# Patient Record
Sex: Female | Born: 1940 | Race: White | Hispanic: No | Smoking: Never smoker
Health system: Southern US, Community
[De-identification: ages and names within clinical notes are randomized; demographics above are authoritative.]

## PROBLEM LIST (undated history)

## (undated) DIAGNOSIS — E079 Disorder of thyroid, unspecified: Secondary | ICD-10-CM

## (undated) DIAGNOSIS — E7800 Pure hypercholesterolemia, unspecified: Secondary | ICD-10-CM

## (undated) DIAGNOSIS — I341 Nonrheumatic mitral (valve) prolapse: Secondary | ICD-10-CM

## (undated) DIAGNOSIS — R42 Dizziness and giddiness: Secondary | ICD-10-CM

## (undated) DIAGNOSIS — J986 Disorders of diaphragm: Secondary | ICD-10-CM

## (undated) DIAGNOSIS — I4891 Unspecified atrial fibrillation: Secondary | ICD-10-CM

## (undated) HISTORY — PX: APPENDECTOMY: SHX54

## (undated) HISTORY — PX: CHOLECYSTECTOMY: SHX55

---

## 2019-06-13 ENCOUNTER — Encounter (HOSPITAL_BASED_OUTPATIENT_CLINIC_OR_DEPARTMENT_OTHER): Payer: Self-pay

## 2019-06-13 ENCOUNTER — Emergency Department (HOSPITAL_BASED_OUTPATIENT_CLINIC_OR_DEPARTMENT_OTHER): Payer: Medicare HMO

## 2019-06-13 ENCOUNTER — Emergency Department (HOSPITAL_BASED_OUTPATIENT_CLINIC_OR_DEPARTMENT_OTHER)
Admission: EM | Admit: 2019-06-13 | Discharge: 2019-06-13 | Disposition: A | Discharge status: Home / Self Care | Payer: Medicare HMO | Attending: Emergency Medicine | Admitting: Emergency Medicine

## 2019-06-13 ENCOUNTER — Other Ambulatory Visit: Payer: Self-pay

## 2019-06-13 DIAGNOSIS — Y9389 Activity, other specified: Secondary | ICD-10-CM | POA: Diagnosis not present

## 2019-06-13 DIAGNOSIS — I4891 Unspecified atrial fibrillation: Secondary | ICD-10-CM | POA: Diagnosis not present

## 2019-06-13 DIAGNOSIS — E782 Mixed hyperlipidemia: Secondary | ICD-10-CM | POA: Insufficient documentation

## 2019-06-13 DIAGNOSIS — W19XXXA Unspecified fall, initial encounter: Secondary | ICD-10-CM

## 2019-06-13 DIAGNOSIS — Y92000 Kitchen of unspecified non-institutional (private) residence as  the place of occurrence of the external cause: Secondary | ICD-10-CM | POA: Insufficient documentation

## 2019-06-13 DIAGNOSIS — M25511 Pain in right shoulder: Secondary | ICD-10-CM | POA: Diagnosis present

## 2019-06-13 DIAGNOSIS — Y999 Unspecified external cause status: Secondary | ICD-10-CM | POA: Diagnosis not present

## 2019-06-13 DIAGNOSIS — W01198A Fall on same level from slipping, tripping and stumbling with subsequent striking against other object, initial encounter: Secondary | ICD-10-CM | POA: Insufficient documentation

## 2019-06-13 DIAGNOSIS — M79601 Pain in right arm: Secondary | ICD-10-CM

## 2019-06-13 DIAGNOSIS — S0083XA Contusion of other part of head, initial encounter: Secondary | ICD-10-CM

## 2019-06-13 DIAGNOSIS — E079 Disorder of thyroid, unspecified: Secondary | ICD-10-CM | POA: Insufficient documentation

## 2019-06-13 HISTORY — DX: Nonrheumatic mitral (valve) prolapse: I34.1

## 2019-06-13 HISTORY — DX: Disorders of diaphragm: J98.6

## 2019-06-13 HISTORY — DX: Dizziness and giddiness: R42

## 2019-06-13 HISTORY — DX: Pure hypercholesterolemia, unspecified: E78.00

## 2019-06-13 HISTORY — DX: Unspecified atrial fibrillation: I48.91

## 2019-06-13 HISTORY — DX: Disorder of thyroid, unspecified: E07.9

## 2019-06-13 MED ORDER — ONDANSETRON 4 MG PO TBDP
4.0000 mg | ORAL_TABLET | Freq: Once | ORAL | Status: AC
  Administered 2019-06-13: 17:00:00 4 mg via ORAL
  Filled 2019-06-13: qty 1

## 2019-06-13 MED ORDER — HYDROCODONE-ACETAMINOPHEN 5-325 MG PO TABS: 0.5000 | ORAL_TABLET | Freq: Four times a day (QID) | ORAL | 0 refills | Status: DC | PRN

## 2019-06-13 MED ORDER — HYDROCODONE-ACETAMINOPHEN 5-325 MG PO TABS
1.0000 | ORAL_TABLET | Freq: Once | ORAL | Status: AC
  Administered 2019-06-13: 1 via ORAL
  Filled 2019-06-13: qty 1

## 2019-06-13 MED ORDER — ONDANSETRON 4 MG PO TBDP: 4.0000 mg | ORAL_TABLET | Freq: Three times a day (TID) | ORAL | 0 refills | Status: DC | PRN

## 2019-06-13 MED FILL — ONDANSETRON ODT 4 MG TABLET: 4 | 4 days supply | Qty: 10 | Fill #0

## 2019-06-13 MED FILL — HYDROCODON-APAP 5-325: 5-325 | 3 days supply | Qty: 10 | Fill #0

## 2019-06-13 NOTE — Discharge Instructions (Signed)
Please read and follow all provided instructions.  Your diagnoses today include:  1. Acute pain of right shoulder   2. Pain of right upper extremity   3. Fall, initial encounter   4. Contusion of face, initial encounter     Tests performed today include:  An x-ray of the affected areas - do NOT show any broken bones  Vital signs. See below for your results today.   Medications prescribed:   Vicodin (hydrocodone/acetaminophen) - narcotic pain medication  DO NOT drive or perform any activities that require you to be awake and alert because this medicine can make you drowsy. BE VERY CAREFUL not to take multiple medicines containing Tylenol (also called acetaminophen). Doing so can lead to an overdose which can damage your liver and cause liver failure and possibly death.  Use pain medication only under direct supervision at the lowest possible dose needed to control your pain.    Zofran (ondansetron) - for nausea and vomiting  Take any prescribed medications only as directed.  Home care instructions:   Follow any educational materials contained in this packet  Follow R.I.C.E. Protocol:  R - rest your injury   I  - use ice on injury without applying directly to skin  C - compress injury with bandage or splint  E - elevate the injury as much as possible  Follow-up instructions: Please follow-up with your primary care provider or the provided orthopedic physician (bone specialist) if you continue to have significant pain in 1 week. In this case you may have a more severe injury that requires further care.   Return instructions:   Please return if your fingers are numb or tingling, appear gray or blue, or you have severe pain (also elevate the arm and loosen splint or wrap if you were given one)  Please return to the Emergency Department if you experience worsening symptoms.   Please return if you have any other emergent concerns.  Additional Information:  Your vital  signs today were: BP (!) 186/84 (BP Location: Left Arm)    Pulse 95    Temp 98.3 F (36.8 C) (Oral)    Resp 20    Ht 5\' 3"  (1.6 m)    Wt 70.3 kg    SpO2 99%    BMI 27.46 kg/m  If your blood pressure (BP) was elevated above 135/85 this visit, please have this repeated by your doctor within one month. --------------

## 2019-06-13 NOTE — ED Triage Notes (Addendum)
Pt states she has long hx of vertigo-states she had episode of vertigo and fell 12/5-states she fell on left side-pain to right side of neck and right UE-yellowish bruising noted below left eye and forehead-denies LOC-pt NAD/anxious-to triage in w/c-last dose tylenol ~1230pm-daughter with pt

## 2019-06-13 NOTE — ED Provider Notes (Signed)
[facility] EMERGENCY DEPARTMENT Provider Note   CSN: [id number] Arrival date & time: 06/13/19  1437     History   Chief Complaint Chief Complaint  Patient presents with  . Fall    HPI Nancy NoeMary A Franco is a 78 y.o. female.     Patient with history of chronic dizziness/lightheadedness presents to the emergency department after a fall occurring 4 days ago.  Patient states that she was feeling particularly dizzy at home.  She got up to walk to the restroom and became very weak.  She fell striking the left side of her head on kitchen cabinets and then fell onto her right shoulder area.  Patient complains of worsening pain in her left upper arm and shoulder.  She has spasms of pains at times.  No distal numbness or tingling.  She has not had any confusion, vomiting or mental status changes.  Patient presents with her daughter.  She has been taking a lot of ibuprofen and Tylenol without improvement.  Pain is worse with movement but she has pain at rest as well.  She does not have an orthopedic doctor.  Pain radiates up into the top of her shoulder.  No shortness of breath or trouble breathing.     Past Medical History:  Diagnosis Date  . A-fib (HCC)   . Hemidiaphragm paralysis   . High cholesterol   . MVP (mitral valve prolapse)   . Thyroid disease   . Vertigo     There are no active problems to display for this patient.   Past Surgical History:  Procedure Laterality Date  . APPENDECTOMY    . CHOLECYSTECTOMY       OB History   No obstetric history on file.      Home Medications    Prior to Admission medications   Not on File    Family History No family history on file.  Social History Social History   Tobacco Use  . Smoking status: Never Smoker  . Smokeless tobacco: Never Used  Substance Use Topics  . Alcohol use: Never    Frequency: Never  . Drug use: Never     Allergies   Patient has no known allergies.   Review of Systems Review of  Systems  Constitutional: Negative for activity change and fatigue.  HENT: Negative for tinnitus.   Eyes: Negative for photophobia, pain and visual disturbance.  Respiratory: Negative for shortness of breath.   Cardiovascular: Negative for chest pain.  Gastrointestinal: Negative for nausea and vomiting.  Musculoskeletal: Positive for arthralgias, myalgias and neck pain. Negative for back pain, gait problem and joint swelling.  Skin: Positive for color change. Negative for wound.  Neurological: Negative for dizziness, weakness, light-headedness, numbness and headaches.  Psychiatric/Behavioral: Negative for confusion and decreased concentration.     Physical Exam Updated Vital Signs BP (!) 186/84 (BP Location: Left Arm)   Pulse 95   Temp 98.3 F (36.8 C) (Oral)   Resp 20   Ht 5\' 3"  (1.6 m)   Wt 70.3 kg   SpO2 99%   BMI 27.46 kg/m   Physical Exam Vitals signs and nursing note reviewed.  Constitutional:      Appearance: She is well-developed.  HENT:     Head: Normocephalic and atraumatic. No raccoon eyes or Battle's sign.     Right Ear: Tympanic membrane, ear canal and external ear normal. No hemotympanum.     Left Ear: Tympanic membrane, ear canal and external ear normal. No hemotympanum.  Nose: Nose normal.     Mouth/Throat:     Pharynx: Uvula midline.  Eyes:     General: Lids are normal.     Extraocular Movements:     Right eye: No nystagmus.     Left eye: No nystagmus.     Conjunctiva/sclera: Conjunctivae normal.     Pupils: Pupils are equal, round, and reactive to light.     Comments: No visible hyphema noted  Neck:     Musculoskeletal: Normal range of motion and neck supple.  Cardiovascular:     Rate and Rhythm: Normal rate and regular rhythm.  Pulmonary:     Effort: Pulmonary effort is normal.     Breath sounds: Normal breath sounds.  Abdominal:     Palpations: Abdomen is soft.     Tenderness: There is no abdominal tenderness.  Musculoskeletal:     Right  shoulder: She exhibits tenderness and bony tenderness. She exhibits normal range of motion.     Right elbow: Normal.    Right wrist: Normal.     Cervical back: She exhibits tenderness. She exhibits normal range of motion and no bony tenderness.     Thoracic back: She exhibits no tenderness and no bony tenderness.     Lumbar back: She exhibits no tenderness and no bony tenderness.       Back:     Right upper arm: She exhibits tenderness. She exhibits no swelling and no edema.  Skin:    General: Skin is warm and dry.  Neurological:     Mental Status: She is alert and oriented to person, place, and time.     GCS: GCS eye subscore is 4. GCS verbal subscore is 5. GCS motor subscore is 6.     Cranial Nerves: No cranial nerve deficit.     Sensory: No sensory deficit.     Coordination: Coordination normal.     Deep Tendon Reflexes: Reflexes are normal and symmetric.      ED Treatments / Results  Labs (all labs ordered are listed, but only abnormal results are displayed) Labs Reviewed - No data to display  EKG None  Radiology Dg Shoulder Right  Result Date: 06/13/2019 CLINICAL DATA:  Right shoulder pain since a fall 4-5 days ago. EXAM: RIGHT SHOULDER - 2+ VIEW COMPARISON:  None. FINDINGS: There is no fracture, dislocation, or abnormal self tissue calcification. Minimal osteophyte formation on the inferior rim of the glenoid. IMPRESSION: No acute abnormality. Minimal degenerative changes of the glenohumeral joint. Electronically Signed   By: Francene Boyers M.D.   On: 06/13/2019 16:39   Dg Humerus Right  Result Date: 06/13/2019 CLINICAL DATA:  Right shoulder and arm pain secondary to a fall 4-5 days ago. EXAM: RIGHT HUMERUS - 2+ VIEW COMPARISON:  None. FINDINGS: There is no evidence of fracture or other focal bone lesions. Soft tissues are unremarkable. IMPRESSION: Negative. Electronically Signed   By: Francene Boyers M.D.   On: 06/13/2019 16:39    Procedures Procedures (including  critical care time)  Medications Ordered in ED Medications  HYDROcodone-acetaminophen (NORCO/VICODIN) 5-325 MG per tablet 1 tablet (1 tablet Oral Given 06/13/19 1559)  ondansetron (ZOFRAN-ODT) disintegrating tablet 4 mg (4 mg Oral Given 06/13/19 1706)     Initial Impression / Assessment and Plan / ED Course  I have reviewed the triage vital signs and the nursing notes.  Pertinent labs & imaging results that were available during my care of the patient were reviewed by me and considered in my  medical decision making (see chart for details).        Patient seen and examined. Work-up initiated. Medications ordered.  Right upper extremity is neurovascularly intact.  Patient moves her arm around reasonably well but complains of pain.  Will need imaging to evaluate for fracture.  Discussed possibility of soft tissue injury or ligamentous injury in the shoulder.  Vital signs reviewed and are as follows: BP (!) 186/84 (BP Location: Left Arm)   Pulse 95   Temp 98.3 F (36.8 C) (Oral)   Resp 20   Ht 5\' 3"  (1.6 m)   Wt 70.3 kg   SpO2 99%   BMI 27.46 kg/m   5:26 PM x-rays reviewed.  Patient has tolerated Vicodin.  She has had this in the past.  Current plan is home with prescription for hydrocodone and Zofran.  Patient provided with a sling in the emergency department.  She will use this for comfort.  Sports medicine follow-up given in case symptoms are not improving.  Use pain medication only under direct supervision at the lowest possible dose needed to control your pain.   Patient's family at bedside is in agreement with plan.   Final Clinical Impressions(s) / ED Diagnoses   Final diagnoses:  Acute pain of right shoulder  Pain of right upper extremity  Fall, initial encounter  Contusion of face, initial encounter   Patient with right shoulder and upper arm pain, after a fall several days ago.  Her shoulder and arm pain have been worsening.  Patient with likely musculoskeletal  pain but cannot rule out radicular pain.  She does not have any weakness.  Treatment and follow-up plan as above.  Minor facial contusion.  Patient has had no signs of closed head injury or concussion and she seems to be improving from this standpoint.  Do not feel that she needs imaging of the head, face, neck at this time given clinical provement over the past several days.  ED Discharge Orders         Ordered    HYDROcodone-acetaminophen (NORCO/VICODIN) 5-325 MG tablet  Every 6 hours PRN     06/13/19 1656    ondansetron (ZOFRAN ODT) 4 MG disintegrating tablet  Every 8 hours PRN     06/13/19 1656           Carlisle Cater, PA-C 06/13/19 1729    Lajean Saver, MD 06/13/19 1858

## 2019-06-25 ENCOUNTER — Encounter (HOSPITAL_BASED_OUTPATIENT_CLINIC_OR_DEPARTMENT_OTHER): Payer: Self-pay | Admitting: *Deleted

## 2019-06-25 ENCOUNTER — Inpatient Hospital Stay (HOSPITAL_BASED_OUTPATIENT_CLINIC_OR_DEPARTMENT_OTHER)
Admission: EM | Admit: 2019-06-25 | Discharge: 2019-07-01 | DRG: 392 | Disposition: A | Discharge status: Home / Self Care | Payer: Medicare HMO | Attending: Internal Medicine | Admitting: Internal Medicine

## 2019-06-25 ENCOUNTER — Other Ambulatory Visit: Payer: Self-pay

## 2019-06-25 DIAGNOSIS — Z79890 Hormone replacement therapy (postmenopausal): Secondary | ICD-10-CM

## 2019-06-25 DIAGNOSIS — K529 Noninfective gastroenteritis and colitis, unspecified: Secondary | ICD-10-CM | POA: Diagnosis not present

## 2019-06-25 DIAGNOSIS — R32 Unspecified urinary incontinence: Secondary | ICD-10-CM | POA: Diagnosis present

## 2019-06-25 DIAGNOSIS — E876 Hypokalemia: Secondary | ICD-10-CM | POA: Diagnosis present

## 2019-06-25 DIAGNOSIS — D72829 Elevated white blood cell count, unspecified: Secondary | ICD-10-CM | POA: Diagnosis present

## 2019-06-25 DIAGNOSIS — E860 Dehydration: Secondary | ICD-10-CM | POA: Diagnosis present

## 2019-06-25 DIAGNOSIS — E872 Acidosis: Secondary | ICD-10-CM | POA: Diagnosis present

## 2019-06-25 DIAGNOSIS — N3000 Acute cystitis without hematuria: Secondary | ICD-10-CM | POA: Diagnosis present

## 2019-06-25 DIAGNOSIS — E7800 Pure hypercholesterolemia, unspecified: Secondary | ICD-10-CM | POA: Diagnosis present

## 2019-06-25 DIAGNOSIS — R1084 Generalized abdominal pain: Secondary | ICD-10-CM

## 2019-06-25 DIAGNOSIS — E785 Hyperlipidemia, unspecified: Secondary | ICD-10-CM | POA: Diagnosis present

## 2019-06-25 DIAGNOSIS — I4891 Unspecified atrial fibrillation: Secondary | ICD-10-CM | POA: Diagnosis present

## 2019-06-25 DIAGNOSIS — Z9049 Acquired absence of other specified parts of digestive tract: Secondary | ICD-10-CM

## 2019-06-25 DIAGNOSIS — R109 Unspecified abdominal pain: Secondary | ICD-10-CM

## 2019-06-25 DIAGNOSIS — R739 Hyperglycemia, unspecified: Secondary | ICD-10-CM | POA: Diagnosis present

## 2019-06-25 DIAGNOSIS — Z20828 Contact with and (suspected) exposure to other viral communicable diseases: Secondary | ICD-10-CM | POA: Diagnosis present

## 2019-06-25 DIAGNOSIS — K5900 Constipation, unspecified: Secondary | ICD-10-CM | POA: Diagnosis present

## 2019-06-25 DIAGNOSIS — R17 Unspecified jaundice: Secondary | ICD-10-CM | POA: Diagnosis present

## 2019-06-25 DIAGNOSIS — E039 Hypothyroidism, unspecified: Secondary | ICD-10-CM | POA: Diagnosis present

## 2019-06-25 DIAGNOSIS — I10 Essential (primary) hypertension: Secondary | ICD-10-CM | POA: Diagnosis present

## 2019-06-25 DIAGNOSIS — N390 Urinary tract infection, site not specified: Secondary | ICD-10-CM | POA: Diagnosis present

## 2019-06-25 DIAGNOSIS — R296 Repeated falls: Secondary | ICD-10-CM | POA: Diagnosis present

## 2019-06-25 LAB — CBC WITH DIFFERENTIAL/PLATELET
Abs Immature Granulocytes: 0.05 10*3/uL (ref 0.00–0.07)
Basophils Absolute: 0 10*3/uL (ref 0.0–0.1)
Basophils Relative: 0 %
Eosinophils Absolute: 0 10*3/uL (ref 0.0–0.5)
Eosinophils Relative: 0 %
HCT: 50.3 % — ABNORMAL HIGH (ref 36.0–46.0)
Hemoglobin: 16.5 g/dL — ABNORMAL HIGH (ref 12.0–15.0)
Immature Granulocytes: 0 %
Lymphocytes Relative: 7 %
Lymphs Abs: 1 10*3/uL (ref 0.7–4.0)
MCH: 31.7 pg (ref 26.0–34.0)
MCHC: 32.8 g/dL (ref 30.0–36.0)
MCV: 96.5 fL (ref 80.0–100.0)
Monocytes Absolute: 0.9 10*3/uL (ref 0.1–1.0)
Monocytes Relative: 6 %
Neutro Abs: 13.8 10*3/uL — ABNORMAL HIGH (ref 1.7–7.7)
Neutrophils Relative %: 87 %
Platelets: 281 10*3/uL (ref 150–400)
RBC: 5.21 MIL/uL — ABNORMAL HIGH (ref 3.87–5.11)
RDW: 13.4 % (ref 11.5–15.5)
WBC: 15.9 10*3/uL — ABNORMAL HIGH (ref 4.0–10.5)
nRBC: 0 % (ref 0.0–0.2)

## 2019-06-25 MED ORDER — SODIUM CHLORIDE 0.9 % IV SOLN: INTRAVENOUS | Status: DC

## 2019-06-25 MED ORDER — ONDANSETRON HCL 4 MG/2ML IJ SOLN
4.0000 mg | Freq: Once | INTRAMUSCULAR | Status: AC
  Administered 2019-06-25: 4 mg via INTRAVENOUS
  Filled 2019-06-25: qty 2

## 2019-06-25 MED ORDER — SODIUM CHLORIDE 0.9 % IV BOLUS
500.0000 mL | Freq: Once | INTRAVENOUS | Status: AC
  Administered 2019-06-25: 500 mL via INTRAVENOUS

## 2019-06-25 NOTE — ED Provider Notes (Addendum)
[facility] EMERGENCY DEPARTMENT Provider Note   CSN: [id number] Arrival date & time: 06/25/19  2153     History Chief Complaint  Patient presents with  . Constipation    Nancy Franco is a 78 y.o. female.  Patient with the complaint of abdominal pain kind of mid abdomen constipation other than a little bit of a warty bowel movement today.  And nausea and vomiting to include dry heaves for the past 2 weeks.  Patient was seen December 9 started on pain medication following a fall on December 5.  Patient was started on pain medication for this.  But has not taken it for the past 5 or 6 days.  Patient has not had a past history of constipation.  No fevers.  No blood in the bowel movements or the vomit.  No chest pain no shortness of breath no cough no upper respiratory symptoms.  Past medical history is significant for vertigo atrial fib mitral valve prolapse thyroid disease and high cholesterol.  Had her gallbladder removed and her appendix removed.        Past Medical History:  Diagnosis Date  . A-fib (HCC)   . Hemidiaphragm paralysis   . High cholesterol   . MVP (mitral valve prolapse)   . Thyroid disease   . Vertigo     There are no problems to display for this patient.   Past Surgical History:  Procedure Laterality Date  . APPENDECTOMY    . CHOLECYSTECTOMY       OB History   No obstetric history on file.     No family history on file.  Social History   Tobacco Use  . Smoking status: Never Smoker  . Smokeless tobacco: Never Used  Substance Use Topics  . Alcohol use: Never  . Drug use: Never    Home Medications Prior to Admission medications   Medication Sig Start Date End Date Taking? Authorizing Provider  HYDROcodone-acetaminophen (NORCO/VICODIN) 5-325 MG tablet Take 0.5-1 tablets by mouth every 6 (six) hours as needed for moderate pain or severe pain. 06/13/19   Renne Crigler, PA-C  ondansetron (ZOFRAN ODT) 4 MG disintegrating tablet Take  1 tablet (4 mg total) by mouth every 8 (eight) hours as needed for nausea or vomiting. 06/13/19   Renne Crigler, PA-C    Allergies    Patient has no known allergies.  Review of Systems   Review of Systems  Constitutional: Negative for chills and fever.  HENT: Negative for congestion, rhinorrhea and sore throat.   Eyes: Negative for visual disturbance.  Respiratory: Negative for cough and shortness of breath.   Cardiovascular: Negative for chest pain and leg swelling.  Gastrointestinal: Positive for abdominal pain, constipation, nausea and vomiting. Negative for blood in stool and diarrhea.  Genitourinary: Negative for dysuria.  Musculoskeletal: Negative for back pain and neck pain.  Skin: Negative for rash.  Neurological: Negative for dizziness, light-headedness and headaches.  Hematological: Does not bruise/bleed easily.  Psychiatric/Behavioral: Negative for confusion.    Physical Exam Updated Vital Signs BP 117/70   Pulse 94   Temp 97.7 F (36.5 C)   Resp 16   Ht 1.6 m (5\' 3" )   Wt 70 kg   SpO2 97%   BMI 27.34 kg/m   Physical Exam Vitals and nursing note reviewed.  Constitutional:      General: She is not in acute distress.    Appearance: Normal appearance. She is well-developed.  HENT:     Head: Normocephalic and  atraumatic.  Eyes:     Extraocular Movements: Extraocular movements intact.     Conjunctiva/sclera: Conjunctivae normal.     Pupils: Pupils are equal, round, and reactive to light.  Cardiovascular:     Rate and Rhythm: Normal rate and regular rhythm.     Heart sounds: No murmur.  Pulmonary:     Effort: Pulmonary effort is normal. No respiratory distress.     Breath sounds: Normal breath sounds.  Abdominal:     General: There is no distension.     Palpations: Abdomen is soft.     Tenderness: There is abdominal tenderness.     Comments: Is to palpation around periumbilical area.  No obvious mass or hernia.  Abdomen not distended.  No guarding.    Musculoskeletal:     Cervical back: Normal range of motion and neck supple.  Skin:    General: Skin is warm and dry.  Neurological:     General: No focal deficit present.     Mental Status: She is alert and oriented to person, place, and time.     ED Results / Procedures / Treatments   Labs (all labs ordered are listed, but only abnormal results are displayed) Labs Reviewed  CBC WITH DIFFERENTIAL/PLATELET  COMPREHENSIVE METABOLIC PANEL  LIPASE, BLOOD    EKG EKG Interpretation  Date/Time:  Monday June 25 2019 23:30:44 EST Ventricular Rate:  90 PR Interval:    QRS Duration: 84 QT Interval:  366 QTC Calculation: 448 R Axis:   65 Text Interpretation: Sinus rhythm Atrial premature complexes Borderline T wave abnormalities Baseline wander in lead(s) II aVF No previous ECGs available Confirmed by Fredia Sorrow [phone]) on 06/25/2019 11:41:26 PM   Radiology No results found.  Procedures Procedures (including critical care time)  Medications Ordered in ED Medications  0.9 %  sodium chloride infusion ( Intravenous New Bag/Given 06/25/19 2340)  sodium chloride 0.9 % bolus 500 mL (has no administration in time range)  ondansetron (ZOFRAN) injection 4 mg (4 mg Intravenous Given 06/25/19 2340)    ED Course  I have reviewed the triage vital signs and the nursing notes.  Pertinent labs & imaging results that were available during my care of the patient were reviewed by me and considered in my medical decision making (see chart for details).    MDM Rules/Calculators/A&P                     Elderly patient with abdominal pain and a history for the past 2 weeks of constipation.  But is also having nausea and vomiting.  Patient brought in from home lives with her daughter.  No COVID-19 symptoms.   Patient was on pain medication but has not taken any for the past 6 days.  Was on pain medication following a fall on December 5.  Patient will need labs IV hydration  antinausea medicine and CT scan of the abdomen to rule out any acute abdominal process.  Patient is already had her gallbladder and appendix removed.  Symptoms could be related to just constipation.  Patient not taking any laxative.  CT scan will direct disposition.  No COVID-19 symptoms.   Final Clinical Impression(s) / ED Diagnoses Final diagnoses:  Generalized abdominal pain    Rx / DC Orders ED Discharge Orders    None       Fredia Sorrow, MD 06/25/19 2308    Fredia Sorrow, MD 06/25/19 [phone]

## 2019-06-25 NOTE — ED Notes (Signed)
Up to [facility].

## 2019-06-25 NOTE — ED Triage Notes (Signed)
Pt brought in by EMS from home, c/o constipation x2 weeks , abd pain

## 2019-06-26 ENCOUNTER — Encounter (HOSPITAL_BASED_OUTPATIENT_CLINIC_OR_DEPARTMENT_OTHER): Payer: Self-pay

## 2019-06-26 ENCOUNTER — Emergency Department (HOSPITAL_BASED_OUTPATIENT_CLINIC_OR_DEPARTMENT_OTHER): Payer: Medicare HMO

## 2019-06-26 DIAGNOSIS — N3000 Acute cystitis without hematuria: Secondary | ICD-10-CM

## 2019-06-26 DIAGNOSIS — K5900 Constipation, unspecified: Secondary | ICD-10-CM | POA: Diagnosis not present

## 2019-06-26 DIAGNOSIS — E876 Hypokalemia: Secondary | ICD-10-CM | POA: Diagnosis not present

## 2019-06-26 DIAGNOSIS — Z9049 Acquired absence of other specified parts of digestive tract: Secondary | ICD-10-CM | POA: Diagnosis not present

## 2019-06-26 DIAGNOSIS — E039 Hypothyroidism, unspecified: Secondary | ICD-10-CM | POA: Diagnosis not present

## 2019-06-26 DIAGNOSIS — E872 Acidosis: Secondary | ICD-10-CM | POA: Diagnosis not present

## 2019-06-26 DIAGNOSIS — I10 Essential (primary) hypertension: Secondary | ICD-10-CM | POA: Diagnosis not present

## 2019-06-26 DIAGNOSIS — R296 Repeated falls: Secondary | ICD-10-CM | POA: Diagnosis not present

## 2019-06-26 DIAGNOSIS — Z20828 Contact with and (suspected) exposure to other viral communicable diseases: Secondary | ICD-10-CM | POA: Diagnosis not present

## 2019-06-26 DIAGNOSIS — N390 Urinary tract infection, site not specified: Secondary | ICD-10-CM | POA: Diagnosis present

## 2019-06-26 DIAGNOSIS — K529 Noninfective gastroenteritis and colitis, unspecified: Principal | ICD-10-CM

## 2019-06-26 DIAGNOSIS — E860 Dehydration: Secondary | ICD-10-CM

## 2019-06-26 DIAGNOSIS — E782 Mixed hyperlipidemia: Secondary | ICD-10-CM

## 2019-06-26 DIAGNOSIS — E7800 Pure hypercholesterolemia, unspecified: Secondary | ICD-10-CM | POA: Diagnosis not present

## 2019-06-26 DIAGNOSIS — E785 Hyperlipidemia, unspecified: Secondary | ICD-10-CM | POA: Diagnosis present

## 2019-06-26 DIAGNOSIS — R17 Unspecified jaundice: Secondary | ICD-10-CM | POA: Diagnosis not present

## 2019-06-26 DIAGNOSIS — D72829 Elevated white blood cell count, unspecified: Secondary | ICD-10-CM

## 2019-06-26 DIAGNOSIS — R32 Unspecified urinary incontinence: Secondary | ICD-10-CM | POA: Diagnosis not present

## 2019-06-26 DIAGNOSIS — Z79890 Hormone replacement therapy (postmenopausal): Secondary | ICD-10-CM | POA: Diagnosis not present

## 2019-06-26 DIAGNOSIS — I4891 Unspecified atrial fibrillation: Secondary | ICD-10-CM | POA: Diagnosis not present

## 2019-06-26 DIAGNOSIS — R739 Hyperglycemia, unspecified: Secondary | ICD-10-CM | POA: Diagnosis not present

## 2019-06-26 LAB — COMPREHENSIVE METABOLIC PANEL
ALT: 22 U/L (ref 0–44)
ALT: 24 U/L (ref 0–44)
AST: 24 U/L (ref 15–41)
AST: 28 U/L (ref 15–41)
Albumin: 3.1 g/dL — ABNORMAL LOW (ref 3.5–5.0)
Albumin: 3.7 g/dL (ref 3.5–5.0)
Alkaline Phosphatase: 74 U/L (ref 38–126)
Alkaline Phosphatase: 89 U/L (ref 38–126)
Anion gap: 12 (ref 5–15)
Anion gap: 9 (ref 5–15)
BUN: 18 mg/dL (ref 8–23)
BUN: 21 mg/dL (ref 8–23)
CO2: 23 mmol/L (ref 22–32)
CO2: 25 mmol/L (ref 22–32)
Calcium: 8.6 mg/dL — ABNORMAL LOW (ref 8.9–10.3)
Calcium: 9.2 mg/dL (ref 8.9–10.3)
Chloride: 100 mmol/L (ref 98–111)
Chloride: 105 mmol/L (ref 98–111)
Creatinine, Ser: 0.71 mg/dL (ref 0.44–1.00)
Creatinine, Ser: 0.89 mg/dL (ref 0.44–1.00)
GFR calc Af Amer: >60 mL/min (ref >60)
GFR calc Af Amer: >60 mL/min (ref >60)
GFR calc non Af Amer: >60 mL/min (ref >60)
GFR calc non Af Amer: >60 mL/min (ref >60)
Glucose, Bld: 112 mg/dL — ABNORMAL HIGH (ref 70–99)
Glucose, Bld: 140 mg/dL — ABNORMAL HIGH (ref 70–99)
Potassium: 3.7 mmol/L (ref 3.5–5.1)
Potassium: 4.6 mmol/L (ref 3.5–5.1)
Sodium: 137 mmol/L (ref 135–145)
Sodium: 137 mmol/L (ref 135–145)
Total Bilirubin: 1.9 mg/dL — ABNORMAL HIGH (ref 0.3–1.2)
Total Bilirubin: 2 mg/dL — ABNORMAL HIGH (ref 0.3–1.2)
Total Protein: 6.3 g/dL — ABNORMAL LOW (ref 6.5–8.1)
Total Protein: 7 g/dL (ref 6.5–8.1)

## 2019-06-26 LAB — LACTIC ACID, PLASMA: Lactic Acid, Venous: 1.1 mmol/L (ref 0.5–1.9)

## 2019-06-26 LAB — CBC WITH DIFFERENTIAL/PLATELET
Abs Immature Granulocytes: 0.03 10*3/uL (ref 0.00–0.07)
Basophils Absolute: 0.1 10*3/uL (ref 0.0–0.1)
Basophils Relative: 1 %
Eosinophils Absolute: 0.1 10*3/uL (ref 0.0–0.5)
Eosinophils Relative: 1 %
HCT: 42.8 % (ref 36.0–46.0)
Hemoglobin: 13.7 g/dL (ref 12.0–15.0)
Immature Granulocytes: 0 %
Lymphocytes Relative: 16 %
Lymphs Abs: 1.5 10*3/uL (ref 0.7–4.0)
MCH: 31.4 pg (ref 26.0–34.0)
MCHC: 32 g/dL (ref 30.0–36.0)
MCV: 98.2 fL (ref 80.0–100.0)
Monocytes Absolute: 1 10*3/uL (ref 0.1–1.0)
Monocytes Relative: 10 %
Neutro Abs: 6.7 10*3/uL (ref 1.7–7.7)
Neutrophils Relative %: 72 %
Platelets: 246 10*3/uL (ref 150–400)
RBC: 4.36 MIL/uL (ref 3.87–5.11)
RDW: 13.4 % (ref 11.5–15.5)
WBC: 9.4 10*3/uL (ref 4.0–10.5)
nRBC: 0 % (ref 0.0–0.2)

## 2019-06-26 LAB — URINALYSIS, MICROSCOPIC (REFLEX)

## 2019-06-26 LAB — SARS CORONAVIRUS 2 (TAT 6-24 HRS): SARS Coronavirus 2: NEGATIVE

## 2019-06-26 LAB — URINALYSIS, ROUTINE W REFLEX MICROSCOPIC
Bilirubin Urine: NEGATIVE
Glucose, UA: NEGATIVE mg/dL
Ketones, ur: 15 mg/dL — AB
Leukocytes,Ua: NEGATIVE
Nitrite: POSITIVE — AB
Protein, ur: NEGATIVE mg/dL
Specific Gravity, Urine: 1.01 (ref 1.005–1.030)
pH: 5 (ref 5.0–8.0)

## 2019-06-26 LAB — LIPASE, BLOOD: Lipase: 18 U/L (ref 11–51)

## 2019-06-26 MED ORDER — ONDANSETRON HCL 4 MG/2ML IJ SOLN
4.0000 mg | Freq: Four times a day (QID) | INTRAMUSCULAR | Status: DC | PRN
  Administered 2019-06-26 – 2019-06-29 (×6): 4 mg via INTRAVENOUS
  Filled 2019-06-26 (×6): qty 2

## 2019-06-26 MED ORDER — ONDANSETRON HCL 4 MG/2ML IJ SOLN
4.0000 mg | Freq: Once | INTRAMUSCULAR | Status: AC
  Administered 2019-06-26: 4 mg via INTRAVENOUS
  Filled 2019-06-26: qty 2

## 2019-06-26 MED ORDER — FENTANYL CITRATE (PF) 100 MCG/2ML IJ SOLN
50.0000 ug | INTRAMUSCULAR | Status: DC | PRN
  Administered 2019-06-26 (×2): 50 ug via INTRAVENOUS
  Filled 2019-06-26 (×2): qty 2

## 2019-06-26 MED ORDER — DEXTROSE-NACL 5-0.45 % IV SOLN
INTRAVENOUS | Status: DC
  Administered 2019-06-26: 1000 mL via INTRAVENOUS

## 2019-06-26 MED ORDER — ACETAMINOPHEN 325 MG PO TABS: 650.0000 mg | ORAL_TABLET | Freq: Four times a day (QID) | ORAL | Status: DC | PRN

## 2019-06-26 MED ORDER — ATORVASTATIN CALCIUM 40 MG PO TABS
40.0000 mg | ORAL_TABLET | Freq: Every day | ORAL | Status: DC
  Administered 2019-06-27 – 2019-07-01 (×4): 40 mg via ORAL
  Filled 2019-06-26 (×5): qty 1

## 2019-06-26 MED ORDER — CIPROFLOXACIN IN D5W 400 MG/200ML IV SOLN
400.0000 mg | Freq: Two times a day (BID) | INTRAVENOUS | Status: DC
  Administered 2019-06-26 – 2019-07-01 (×10): 400 mg via INTRAVENOUS
  Filled 2019-06-26 (×10): qty 200

## 2019-06-26 MED ORDER — METOPROLOL SUCCINATE ER 50 MG PO TB24
50.0000 mg | ORAL_TABLET | Freq: Every day | ORAL | Status: DC
  Administered 2019-06-26 – 2019-07-01 (×5): 50 mg via ORAL
  Filled 2019-06-26 (×6): qty 1

## 2019-06-26 MED ORDER — METRONIDAZOLE IN NACL 5-0.79 MG/ML-% IV SOLN
500.0000 mg | Freq: Once | INTRAVENOUS | Status: AC
  Administered 2019-06-26: 500 mg via INTRAVENOUS
  Filled 2019-06-26: qty 100

## 2019-06-26 MED ORDER — ONDANSETRON HCL 4 MG PO TABS
4.0000 mg | ORAL_TABLET | Freq: Four times a day (QID) | ORAL | Status: DC | PRN
  Administered 2019-06-29 – 2019-07-01 (×3): 4 mg via ORAL
  Filled 2019-06-26 (×4): qty 1

## 2019-06-26 MED ORDER — IOHEXOL 300 MG/ML  SOLN
100.0000 mL | Freq: Once | INTRAMUSCULAR | Status: AC | PRN
  Administered 2019-06-26: 100 mL via INTRAVENOUS

## 2019-06-26 MED ORDER — CIPROFLOXACIN IN D5W 400 MG/200ML IV SOLN
400.0000 mg | Freq: Once | INTRAVENOUS | Status: AC
  Administered 2019-06-26: 400 mg via INTRAVENOUS
  Filled 2019-06-26: qty 200

## 2019-06-26 MED ORDER — METRONIDAZOLE IN NACL 5-0.79 MG/ML-% IV SOLN
500.0000 mg | Freq: Three times a day (TID) | INTRAVENOUS | Status: DC
  Administered 2019-06-26 – 2019-07-01 (×15): 500 mg via INTRAVENOUS
  Filled 2019-06-26 (×15): qty 100

## 2019-06-26 MED ORDER — MORPHINE SULFATE (PF) 4 MG/ML IV SOLN
4.0000 mg | Freq: Once | INTRAVENOUS | Status: AC
  Administered 2019-06-26: 4 mg via INTRAVENOUS
  Filled 2019-06-26 (×2): qty 1

## 2019-06-26 MED ORDER — SODIUM CHLORIDE 0.9 % IV SOLN: INTRAVENOUS | Status: DC

## 2019-06-26 MED ORDER — HYDROCODONE-ACETAMINOPHEN 5-325 MG PO TABS
1.0000 | ORAL_TABLET | ORAL | Status: DC | PRN
  Administered 2019-06-26: 2 via ORAL
  Administered 2019-06-27: 1 via ORAL
  Filled 2019-06-26: qty 1
  Filled 2019-06-26: qty 2
  Filled 2019-06-26: qty 1
  Filled 2019-06-26: qty 2

## 2019-06-26 MED ORDER — ACETAMINOPHEN 650 MG RE SUPP: 650.0000 mg | Freq: Four times a day (QID) | RECTAL | Status: DC | PRN

## 2019-06-26 MED ORDER — ASPIRIN 81 MG PO CHEW
81.0000 mg | CHEWABLE_TABLET | Freq: Every day | ORAL | Status: DC
  Administered 2019-06-27 – 2019-07-01 (×4): 81 mg via ORAL
  Filled 2019-06-26 (×5): qty 1

## 2019-06-26 MED ORDER — LEVOTHYROXINE SODIUM 50 MCG PO TABS
50.0000 ug | ORAL_TABLET | Freq: Every day | ORAL | Status: DC
  Administered 2019-06-27 – 2019-07-01 (×5): 50 ug via ORAL
  Filled 2019-06-26 (×5): qty 1

## 2019-06-26 NOTE — ED Provider Notes (Signed)
  Physical Exam  BP (!) 123/56   Pulse 68   Temp 98.4 F (36.9 C) (Oral)   Resp 16   Ht 5\' 3"  (1.6 m)   Wt 70 kg   SpO2 92%   BMI 27.34 kg/m   Physical Exam  ED Course/Procedures     Procedures  MDM  When discussed with patient.  Reportedly patient's daughter and patient both mention going home however this point patient states she does not feel stronger to go home but abdomen is feeling much better than before.  Reviewing records does not appear that more antibiotics been ordered.  Will order Cipro and Flagyl.  We will also start some IV fluids.  Still pending bed placement.  Reportedly no beds available.       Davonna Belling, MD 06/26/19 [phone]

## 2019-06-26 NOTE — ED Notes (Signed)
Pt incontinent of large amount of urine  Pads changed and placed in an adult brief per pt request

## 2019-06-26 NOTE — ED Notes (Addendum)
Called daughter chris   Phone  #  [phone]  And updated on room assignment

## 2019-06-26 NOTE — ED Notes (Signed)
Returned from CT.

## 2019-06-26 NOTE — ED Provider Notes (Signed)
  Patient signed out pending CT scan and lab work.  In brief, presents with nausea and abdominal pain.  Lab work notable for leukocytosis to 15.9.  Patient is afebrile.  CT scan suggestive of colitis.  Patient primarily having constipation.  No diarrhea.  She also has some mural nodule of the duodenum that likely will need an EGD.    2:11 AM On recheck of the patient she continues to complain of abdominal pain.  I ordered Cipro and Flagyl to cover for colitis and morphine for pain.  Noted that the patient did not have a urinalysis ordered.   4:46 AM Patient resting comfortably.  She is continuing to complain of some pain.  Urinalysis shows evidence of a UTI that is nitrite positive.  Patient has already received Cipro.  Ordered urine culture.  Given ongoing discomfort and evidence of both colitis and urinary tract infection, feel it is reasonable for admission for further antibiotics and pain management.  Patient is agreeable to plan.  Physical Exam  BP (!) 101/48   Pulse 84   Temp 98.4 F (36.9 C) (Oral)   Resp 16   Ht 1.6 m (5\' 3" )   Wt 70 kg   SpO2 91%   BMI 27.34 kg/m   Physical Exam  Nontoxic-appearing Diffuse tenderness to palpation  ED Course/Procedures     Procedures  MDM   Problem List Items Addressed This Visit    None    Visit Diagnoses    Generalized abdominal pain    -  Primary   Acute cystitis without hematuria       Colitis                Merryl Hacker, MD 06/26/19 [phone]

## 2019-06-26 NOTE — Progress Notes (Signed)
Placed a call to the pharmacy for med reconcile to be done.  Will send a tech up

## 2019-06-26 NOTE — ED Notes (Signed)
Updated daughter chris on pt

## 2019-06-26 NOTE — Progress Notes (Signed)
Pt wishes to remain a Full Code

## 2019-06-26 NOTE — ED Notes (Signed)
Pt talking to daughter

## 2019-06-26 NOTE — H&P (Signed)
Nancy Franco:[id number] DOB: 1941-02-21 DOA: 06/25/2019     PCP: Patient, No Pcp Per  [facility] Specialists:  none  originally from [place]  Patient arrived to ER on 06/25/19 at 2153  Patient coming from: home Lives  With family     Chief Complaint:   Chief Complaint  Patient presents with  . Constipation    HPI: Nancy Franco is a 78 y.o. female with medical history significant of a.fib. HLD, Vertigo, falls    Presented with  2wk hx of Constipation, Nausea no appetite severe abdominal pain. She was seen yesterday in emergency department complaining of constipation for at least 2 weeks with signconstipationificant abdominal pain.  Patient was rehydrated and was able to be discharged to home. She returned to ER today reporting Nausea  abd pain.  She reports only symptoms of UTi is frequency of urination states she has frequent UTi but never have any dysuria. She has seen Urologist in the past. She is usually treated with antibiotics.  Reports recently she have had increased frequency of urination and had to get up at night to urinate.    She has been having nausea   and dry heaving for 2 weeks.  This has started after patient had recent fall on 06/09/2019 she injured her right shoulder was evaluated in the emergency department and was prescribed Vicodin.  She denies any history of fevers no chest pain or shortness of breath Patient is status post apendectomy and cholecystectomy.   Prior to starting Vicodin she was treating her pain with ibuprofen and Tylenol.  Patient is not on blood thinners secondary to recurrent falls  Infectious risk factors:  Reports N/V abdominal pain,        PCR testing   NEGATIVE    Lab Results  Component Value Date   [place] NEGATIVE 06/26/2019     Regarding pertinent Chronic problems:    Hyperlipidemia -  Not on lipitor hypothyroidism - on synthroid  HTN - lisinopril/HCTZ, toprolol   A. Fib -  -  CHA2DS2 vas score 3 : only on 81mg  of Aspirin   Not on anticoagulation secondary to Risk of Falls,        While in ER: Noted to have WBC 15.9 Ct worrisome for colitis Mural duodenal nodule will need EGD Was started on Cipro/Flagyl And Morphine for pain  UA showed nitrites  urine culture obtained Pt is incontinent of urine  The following Work up has been ordered so far:  Orders Placed This Encounter  Procedures  . Urine culture  . SARS CORONAVIRUS 2 (TAT 6-24 HRS) Nasopharyngeal Nasopharyngeal Swab  . CT Abdomen Pelvis W Contrast  . CBC with Differential  . Comprehensive metabolic panel  . Lipase, blood  . Urinalysis, Routine w reflex microscopic  . Urinalysis, Microscopic (reflex)  . In and Out Cath  . Cardiac monitoring  . Consult to hospitalist  ALL PATIENTS BEING ADMITTED/HAVING PROCEDURES NEED COVID-19 SCREENING  . ED EKG  . EKG 12-Lead  . Place in observation (patient's expected length of stay will be less than 2 midnights)     Following Medications were ordered in ER: Medications  0.9 %  sodium chloride infusion ( Intravenous Stopped 06/26/19 1521)  fentaNYL (SUBLIMAZE) injection 50 mcg (50 mcg Intravenous Given 06/26/19 1600)  dextrose 5 %-0.45 % sodium chloride infusion (1,000 mLs Intravenous New Bag/Given 06/26/19 1602)  ciprofloxacin (CIPRO) IVPB 400 mg (400 mg Intravenous New Bag/Given 06/26/19 1710)  metroNIDAZOLE (  FLAGYL) IVPB 500 mg ( Intravenous Stopped 06/26/19 1705)  sodium chloride 0.9 % bolus 500 mL (0 mLs Intravenous Stopped 06/26/19 0017)  ondansetron (ZOFRAN) injection 4 mg (4 mg Intravenous Given 06/25/19 2340)  iohexol (OMNIPAQUE) 300 MG/ML solution 100 mL (100 mLs Intravenous Contrast Given 06/26/19 0039)  ciprofloxacin (CIPRO) IVPB 400 mg (0 mg Intravenous Stopped 06/26/19 0246)  metroNIDAZOLE (FLAGYL) IVPB 500 mg (0 mg Intravenous Stopped 06/26/19 0358)  morphine 4 MG/ML injection 4 mg (4 mg Intravenous Given 06/26/19 0252)  ondansetron  (ZOFRAN) injection 4 mg (4 mg Intravenous Given 06/26/19 0247)        Consult Orders  (From admission, onward)         Start     Ordered   06/26/19 0444  Consult to hospitalist  ALL PATIENTS BEING ADMITTED/HAVING PROCEDURES NEED COVID-19 SCREENING [facility] notified [facility]) - hospitalist consult  Once    Comments: ALL PATIENTS BEING ADMITTED/HAVING PROCEDURES NEED COVID-19 SCREENING  Provider:  (Not yet assigned)  Question Answer Comment  Place call to: [facility]   Reason for Consult Admit      06/26/19 0443           Significant initial  Findings: Abnormal Labs Reviewed  CBC WITH DIFFERENTIAL/PLATELET - Abnormal; Notable for the following components:      Result Value   WBC 15.9 (*)    RBC 5.21 (*)    Hemoglobin 16.5 (*)    HCT 50.3 (*)    Neutro Abs 13.8 (*)    All other components within normal limits  COMPREHENSIVE METABOLIC PANEL - Abnormal; Notable for the following components:   Glucose, Bld 140 (*)    Total Bilirubin 1.9 (*)    All other components within normal limits  URINALYSIS, ROUTINE W REFLEX MICROSCOPIC - Abnormal; Notable for the following components:   APPearance CLOUDY (*)    Hgb urine dipstick SMALL (*)    Ketones, ur 15 (*)    Nitrite POSITIVE (*)    All other components within normal limits  URINALYSIS, MICROSCOPIC (REFLEX) - Abnormal; Notable for the following components:   Bacteria, UA MANY (*)    All other components within normal limits     Otherwise labs showing:    Recent Labs  Lab 06/26/19 0005  NA 137  K 4.6  CO2 25  GLUCOSE 140*  BUN 21  CREATININE 0.89  CALCIUM 9.2    Cr    Stable,  Lab Results  Component Value Date   CREATININE 0.89 06/26/2019    Recent Labs  Lab 06/26/19 0005  AST 28  ALT 24  ALKPHOS 89  BILITOT 1.9*  PROT 7.0  ALBUMIN 3.7   Lab Results  Component Value Date   CALCIUM 9.2 06/26/2019   WBC        Component Value Date/Time   WBC 15.9 (H) 06/25/2019 2337   ANC      Component Value Date/Time   NEUTROABS 13.8 (H) 06/25/2019 2337   ALC No components found for: LYMPHAB    Plt: Lab Results  Component Value Date   PLT 281 06/25/2019   COVID-19 Labs  No results for input(s): DDIMER, FERRITIN, LDH, CRP in the last 72 hours.  Lab Results  Component Value Date   SARSCOV2NAA NEGATIVE 06/26/2019     HG/HCT stable,     Component Value Date/Time   HGB 16.5 (H) 06/25/2019 2337   HCT 50.3 (H) 06/25/2019 2337    Recent Labs  Lab 06/26/19 0005  LIPASE  18   No results for input(s): AMMONIA in the last 168 hours.  No components found for: LABALBU     ECG: Ordered Personally reviewed by me showing: HR : 90 Rhythm:  NSR,  w PAC   no evidence of ischemic changes QTC 448    UA bacteruria, positive nitrites   Urine analysis:    Component Value Date/Time   COLORURINE YELLOW 06/26/2019 0334   APPEARANCEUR CLOUDY (A) 06/26/2019 0334   LABSPEC 1.010 06/26/2019 0334   PHURINE 5.0 06/26/2019 0334   GLUCOSEU NEGATIVE 06/26/2019 0334   HGBUR SMALL (A) 06/26/2019 0334   BILIRUBINUR NEGATIVE 06/26/2019 0334   KETONESUR 15 (A) 06/26/2019 0334   PROTEINUR NEGATIVE 06/26/2019 0334   NITRITE POSITIVE (A) 06/26/2019 0334   LEUKOCYTESUR NEGATIVE 06/26/2019 0334       Ordered   CTabd/pelvis - colitis    ED Triage Vitals  Enc Vitals Group     BP 06/25/19 2156 117/70     Pulse Rate 06/25/19 2156 94     Resp 06/25/19 2156 16     Temp 06/25/19 2156 97.7 F (36.5 C)     Temp Source 06/26/19 0054 Oral     SpO2 06/25/19 2154 97 %     Weight 06/25/19 2156 154 lb 5.2 oz (70 kg)     Height 06/25/19 2156  (1.6 m)     Head Circumference --      Peak Flow --      Pain Score 06/25/19 2155 10     Pain Loc --      Pain Edu? --      Excl. in GC? --   TMAX(24)@       Latest  Blood pressure (!) 154/74, pulse 87, temperature 98.5 F (36.9 C), temperature source Oral, resp. rate 17, height  (1.6 m), weight 70 kg, SpO2 96 %.   Hospitalist  was called for admission for Colitis and dehydration   Review of Systems:    Pertinent positives include: abdominal pain, nausea, vomiting,  Constitutional:  No weight loss, night sweats, Fevers, chills, fatigue, weight loss  HEENT:  No headaches, Difficulty swallowing,Tooth/dental problems,Sore throat,  No sneezing, itching, ear ache, nasal congestion, post nasal drip,  Cardio-vascular:  No chest pain, Orthopnea, PND, anasarca, dizziness, palpitations.no Bilateral lower extremity swelling  GI:  No heartburn, indigestion,  diarrhea, change in bowel habits, loss of appetite, melena, blood in stool, hematemesis Resp:  no shortness of breath at rest. No dyspnea on exertion, No excess mucus, no productive cough, No non-productive cough, No coughing up of blood.No change in color of mucus.No wheezing. Skin:  no rash or lesions. No jaundice GU:  no dysuria, change in color of urine, no urgency or frequency. No straining to urinate.  No flank pain.  Musculoskeletal:  No joint pain or no joint swelling. No decreased range of motion. No back pain.  Psych:  No change in mood or affect. No depression or anxiety. No memory loss.  Neuro: no localizing neurological complaints, no tingling, no weakness, no double vision, no gait abnormality, no slurred speech, no confusion  All systems reviewed and apart from HOPI all are negative  Past Medical History:   Past Medical History:  Diagnosis Date  . A-fib (HCC)   . Hemidiaphragm paralysis   . High cholesterol   . MVP (mitral valve prolapse)   . Thyroid disease   . Vertigo       Past Surgical History:  Procedure Laterality Date  .  APPENDECTOMY    . CHOLECYSTECTOMY      Social History:  Ambulatory   Gilmer Mor recently    reports that she has never smoked. She has never used smokeless tobacco. She reports that she does not drink alcohol or use drugs.     Family History:   Family History  Problem Relation Age of Onset  . Diabetes  Father   . Breast cancer Other     Allergies: No Known Allergies   Prior to Admission medications   Medication Sig Start Date End Date Taking? Authorizing Provider  HYDROcodone-acetaminophen (NORCO/VICODIN) 5-325 MG tablet Take 0.5-1 tablets by mouth every 6 (six) hours as needed for moderate pain or severe pain. 06/13/19   Renne Crigler, PA-C  ondansetron (ZOFRAN ODT) 4 MG disintegrating tablet Take 1 tablet (4 mg total) by mouth every 8 (eight) hours as needed for nausea or vomiting. 06/13/19   Renne Crigler, PA-C   Physical Exam: Blood pressure (!) 154/74, pulse 87, temperature 98.5 F (36.9 C), temperature source Oral, resp. rate 17, height  (1.6 m), weight 70 kg, SpO2 96 %. 1. General:  in No  Acute distress   well  -appearing 2. Psychological: Alert and Oriented 3. Head/ENT:   Dry Mucous Membranes                          Head Non traumatic, neck supple                            Poor Dentition 4. SKIN: decreased Skin turgor,  Skin clean Dry and intact no rash 5. Heart: Regular rate and rhythm no Murmur, no Rub or gallop 6. Lungs:  Clear to auscultation bilaterally, no wheezes or crackles   7. Abdomen: Soft,  non-tender, Non distended  bowel sounds present 8. Lower extremities: no clubbing, cyanosis, no  edema 9. Neurologically Grossly intact, moving all 4 extremities equally   10. MSK: Normal range of motion   All other LABS:     Recent Labs  Lab 06/25/19 2337  WBC 15.9*  NEUTROABS 13.8*  HGB 16.5*  HCT 50.3*  MCV 96.5  PLT 281     Recent Labs  Lab 06/26/19 0005  NA 137  K 4.6  CL 100  CO2 25  GLUCOSE 140*  BUN 21  CREATININE 0.89  CALCIUM 9.2     Recent Labs  Lab 06/26/19 0005  AST 28  ALT 24  ALKPHOS 89  BILITOT 1.9*  PROT 7.0  ALBUMIN 3.7       Cultures: No results found for: SDES, SPECREQUEST, CULT, REPTSTATUS   Radiological Exams on Admission: CT Abdomen Pelvis W Contrast  Result Date: 06/26/2019 CLINICAL DATA:  Nausea,  vomiting, bowel obstruction suspected skull EXAM: CT ABDOMEN AND PELVIS WITH CONTRAST TECHNIQUE: Multidetector CT imaging of the abdomen and pelvis was performed using the standard protocol following bolus administration of intravenous contrast. CONTRAST:  OMNIPAQUE IOHEXOL 300 MG/ML  SOLN COMPARISON:  None. FINDINGS: Lower chest: Asymmetric elevation of the right hemidiaphragm, nonspecific. Some adjacent atelectatic changes noted. Additional bandlike opacities in the left lung base may reflect atelectasis or scarring. Cardiac size is within normal limits. Extensive coronary artery calcification is noted. 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 Trace pericardial fluid within normal limits. Hepatobiliary: No focal liver abnormality is seen. Patient is post cholecystectomy. Slight prominence of the biliary  tree likely related to reservoir effect. No calcified intraductal gallstones. Pancreas: Unremarkable. No pancreatic ductal dilatation or surrounding inflammatory changes. Spleen: Normal in size without focal abnormality. Adrenals/Urinary Tract: Normal adrenal glands. 2.4 cm fluid attenuation cysts seen in the lower pole right kidney. No worrisome renal lesions. Kidneys enhance and excrete symmetrically. No urolithiasis or hydronephrosis. Bladder is unremarkable. Stomach/Bowel: Distal esophagus and stomach are unremarkable. Small 4 mm mural nodule noted in the second portion of the duodenum (3/36). Distal duodenum take takes a normal course. No small bowel dilatation or wall thickening. The appendix is surgically absent. Interposition of the right colon anterior to the liver. There is extensive segmental thickening of the descending and sigmoid colon with pericolonic hazy stranding. There is relative sparing of the distal sigmoid and rectum. There are scattered colonic diverticula in this region but without identifiable culprit diverticulum. No extraluminal  gas is seen. No organized abscess. No air within the draining veins. No convincing evidence of bowel obstruction. Vascular/Lymphatic: Atherosclerotic plaque within the normal caliber aorta. Additional calcifications in the branch vessels. Reactive adenopathy in the low abdomen. No pathologically enlarged nodes. Reproductive: Anteverted uterus with parametrial calcification. No concerning adnexal lesions. Other: Trace free fluid in the left hemipelvis, likely reactive. No free air. No bowel containing hernia. Musculoskeletal: Multilevel degenerative changes are present in the imaged portions of the spine. 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 Additional degenerative changes noted in the hips. No acute osseous abnormality or suspicious osseous lesion. IMPRESSION: 1. Findings are most consistent with colitis involving the descending and sigmoid colon, with relative sparing of the distal sigmoid and rectum. Differential considerations include infectious, inflammatory, or ischemic etiologies. 2. Small 4 mm mural nodule in the second portion of the duodenum, may represent a small duodenal polyp. Consider evaluation with outpatient EGD. 3. Asymmetric elevation of the right hemidiaphragm, nonspecific. 4. Additional incidental and ancillary findings as detailed above. 5. Aortic Atherosclerosis (ICD10-I70.0). Electronically Signed   By: Kreg ShropshirePrice  DeHay M.D.   On: 06/26/2019 01:03    Chart has been reviewed    Assessment/Plan  78 y.o. female with medical history significant of a.fib. HLD, Vertigo, falls     Admitted for colitis and dehydration  Present on Admission: . Colitis -admit for IV fluids and IV antibiotics bowel rest.  Rehydrate and reassess in a.m. no diarrhea.  . Dehydration -rehydrate and follow orthostatics in the morning  . Hypothyroidism - - Check TSH continue home medications at current dose  . Hyperlipidemia -chronic continue home medications  . Essential  hypertension -restart metoprolol hold off on lisinopril/hydrochlorothiazide for tonight given dehydration restart when patient is back to baseline  . Leukocytosis -likely secondary to colitis and hemoconcentration  . Acute lower UTI  -positive bacteria nitrates and leukocytes in the urine with frequency urination typical for patient prior presentation of UTI.  Patient is already on Cipro await results of urine culture    Other plan as per orders.  DVT prophylaxis:  SCD    Code Status:  FULL CODE  as per patient   I had personally discussed CODE STATUS with patient   Family Communication:   Family not at  Bedside    Disposition Plan:     To home once workup is complete and patient is stable                      Would benefit from PT/OT eval prior to DC  Ordered  Consults called: none  Admission status:  ED Disposition    ED Disposition Condition Comment   Admit  Hospital Area: [facility] [[id number]]  Level of Care: Telemetry [5]  Admit to tele based on following criteria: Monitor for Ischemic changes  Covid Evaluation: Asymptomatic Screening Protocol (No Symptoms)  Diagnosis: Colitis [[id number]]  Admitting Physician: Pearson Grippe [[id number]]  Attending Physician: Pearson Grippe [[id number]]       Obs       Level of care    tele  For 12H   Precautions:  Covid Negative  No active isolations   PPE: Used by the provider:   P100  eye Goggles,  Gloves    Alese Furniss 06/26/2019, 11:02 PM    [facility]     after 2 AM please page floor coverage PA If 7AM-7PM, please contact the day team taking care of the patient using [email or web address]

## 2019-06-26 NOTE — ED Notes (Signed)
Pt resting quietly with eyes closed  No acute distress noted at this time

## 2019-06-27 DIAGNOSIS — I10 Essential (primary) hypertension: Secondary | ICD-10-CM | POA: Diagnosis not present

## 2019-06-27 DIAGNOSIS — E860 Dehydration: Secondary | ICD-10-CM | POA: Diagnosis not present

## 2019-06-27 DIAGNOSIS — R42 Dizziness and giddiness: Secondary | ICD-10-CM

## 2019-06-27 DIAGNOSIS — K529 Noninfective gastroenteritis and colitis, unspecified: Secondary | ICD-10-CM | POA: Diagnosis not present

## 2019-06-27 DIAGNOSIS — N390 Urinary tract infection, site not specified: Secondary | ICD-10-CM | POA: Diagnosis not present

## 2019-06-27 LAB — CBC
HCT: 40.8 % (ref 36.0–46.0)
Hemoglobin: 12.8 g/dL (ref 12.0–15.0)
MCH: 31.8 pg (ref 26.0–34.0)
MCHC: 31.4 g/dL (ref 30.0–36.0)
MCV: 101.2 fL — ABNORMAL HIGH (ref 80.0–100.0)
Platelets: 229 10*3/uL (ref 150–400)
RBC: 4.03 MIL/uL (ref 3.87–5.11)
RDW: 13.7 % (ref 11.5–15.5)
WBC: 8.4 10*3/uL (ref 4.0–10.5)
nRBC: 0 % (ref 0.0–0.2)

## 2019-06-27 LAB — COMPREHENSIVE METABOLIC PANEL
ALT: 25 U/L (ref 0–44)
AST: 33 U/L (ref 15–41)
Albumin: 2.9 g/dL — ABNORMAL LOW (ref 3.5–5.0)
Alkaline Phosphatase: 69 U/L (ref 38–126)
Anion gap: 9 (ref 5–15)
BUN: 17 mg/dL (ref 8–23)
CO2: 22 mmol/L (ref 22–32)
Calcium: 8.3 mg/dL — ABNORMAL LOW (ref 8.9–10.3)
Chloride: 107 mmol/L (ref 98–111)
Creatinine, Ser: 0.69 mg/dL (ref 0.44–1.00)
GFR calc Af Amer: >60 mL/min (ref >60)
GFR calc non Af Amer: >60 mL/min (ref >60)
Glucose, Bld: 82 mg/dL (ref 70–99)
Potassium: 4.1 mmol/L (ref 3.5–5.1)
Sodium: 138 mmol/L (ref 135–145)
Total Bilirubin: 2 mg/dL — ABNORMAL HIGH (ref 0.3–1.2)
Total Protein: 5.7 g/dL — ABNORMAL LOW (ref 6.5–8.1)

## 2019-06-27 LAB — MAGNESIUM: Magnesium: 2 mg/dL (ref 1.7–2.4)

## 2019-06-27 LAB — URINE CULTURE: Culture: NO GROWTH

## 2019-06-27 LAB — GLUCOSE, CAPILLARY: Glucose-Capillary: 124 mg/dL — ABNORMAL HIGH (ref 70–99)

## 2019-06-27 LAB — PHOSPHORUS: Phosphorus: 3.1 mg/dL (ref 2.5–4.6)

## 2019-06-27 LAB — TSH: TSH: 1.053 u[IU]/mL (ref 0.350–4.500)

## 2019-06-27 LAB — PREALBUMIN: Prealbumin: 14.3 mg/dL — ABNORMAL LOW (ref 18–38)

## 2019-06-27 MED ORDER — PRO-STAT SUGAR FREE PO LIQD
30.0000 mL | Freq: Two times a day (BID) | ORAL | Status: DC
  Administered 2019-06-27: 30 mL via ORAL
  Filled 2019-06-27 (×3): qty 30

## 2019-06-27 MED ORDER — ADULT MULTIVITAMIN W/MINERALS CH
1.0000 | ORAL_TABLET | Freq: Every day | ORAL | Status: DC
  Administered 2019-06-27: 09:00:00 1 via ORAL
  Filled 2019-06-27 (×4): qty 1

## 2019-06-27 MED ORDER — MECLIZINE HCL 25 MG PO TABS
25.0000 mg | ORAL_TABLET | Freq: Three times a day (TID) | ORAL | Status: DC | PRN
  Administered 2019-06-28 – 2019-07-01 (×4): 25 mg via ORAL
  Filled 2019-06-27 (×4): qty 1

## 2019-06-27 MED ORDER — BOOST / RESOURCE BREEZE PO LIQD CUSTOM
1.0000 | Freq: Three times a day (TID) | ORAL | Status: DC
  Administered 2019-06-27 – 2019-06-28 (×2): 1 via ORAL

## 2019-06-27 NOTE — Evaluation (Signed)
Physical Therapy Evaluation Patient Details Name: Nancy Franco MRN: [id number] DOB: May 18, 1941 Today's Date: 06/27/2019   History of Present Illness  78 yo female admitted with colitis, UTI, dizziness. Hx of A fib, vertigo, falls  Clinical Impression  On eval, pt required Min assist for mobility. She walked ~35 feet around her room. She tolerated activity fairly well. No c/o dizziness during session. Will continue to progress ambulation as tolerated. Recommend RW use for safe ambulation for now. Will also recommend HHPT f/u if pt is agreeable.     Follow Up Recommendations Supervision OOB/mobility;Home health PT    Equipment Recommendations  None recommended by PT(pt stated she has access to DME if needed)    Recommendations for Other Services       Precautions / Restrictions Precautions Precautions: Fall Restrictions Weight Bearing Restrictions: No      Mobility  Bed Mobility Overal bed mobility: Needs Assistance Bed Mobility: Supine to Sit;Sit to Supine     Supine to sit: Min guard;HOB elevated Sit to supine: Min guard;HOB elevated      Transfers Overall transfer level: Needs assistance Equipment used: None Transfers: Sit to/from Stand Sit to Stand: Min assist         General transfer comment: Assist to steady. VCs hand placement.  Ambulation/Gait Ambulation/Gait assistance: Min Chemical engineer (Feet): 35 Feet Assistive device: IV Pole Gait Pattern/deviations: Step-through pattern;Decreased stride length     General Gait Details: Pt took a few laps around the room while using IV pole for support. She tolerated distance well. No c/o dizziness.  Stairs            Wheelchair Mobility    Modified Rankin (Stroke Patients Only)       Balance Overall balance assessment: Needs assistance;History of Falls         Standing balance support: Bilateral upper extremity supported Standing balance-Leahy Scale: Poor                                Pertinent Vitals/Pain Pain Assessment: Faces Pain Score: 3  Faces Pain Scale: Hurts little more Pain Location: R arm Pain Descriptors / Indicators: Discomfort;Sore Pain Intervention(s): Monitored during session;Repositioned    Home Living Family/patient expects to be discharged to:: Private residence Living Arrangements: Children Available Help at Discharge: Family Type of Home: House Home Access: Level entry     Home Layout: Two level Home Equipment: Environmental consultant - 2 wheels;Cane - single point;Shower seat      Prior Function Level of Independence: Independent with assistive device(s)         Comments: has been using walker and cane since fall     Hand Dominance        Extremity/Trunk Assessment   Upper Extremity Assessment Upper Extremity Assessment: Generalized weakness    Lower Extremity Assessment Lower Extremity Assessment: Generalized weakness    Cervical / Trunk Assessment Cervical / Trunk Assessment: Normal  Communication   Communication: No difficulties  Cognition Arousal/Alertness: Awake/alert Behavior During Therapy: WFL for tasks assessed/performed Overall Cognitive Status: Within Functional Limits for tasks assessed                                        General Comments      Exercises     Assessment/Plan    PT Assessment Patient needs continued PT services  PT Problem List Decreased strength;Decreased mobility;Decreased activity tolerance;Decreased balance;Decreased knowledge of use of DME       PT Treatment Interventions DME instruction;Gait training;Therapeutic activities;Patient/family education;Balance training;Functional mobility training;Therapeutic exercise    PT Goals (Current goals can be found in the Care Plan section)  Acute Rehab PT Goals Patient Stated Goal: to get better. PT Goal Formulation: With patient Time For Goal Achievement: 07/12/19 Potential to Achieve Goals: Good    Frequency  Min 3X/week   Barriers to discharge        Co-evaluation               AM-PAC PT "6 Clicks" Mobility  Outcome Measure Help needed turning from your back to your side while in a flat bed without using bedrails?: A Little Help needed moving from lying on your back to sitting on the side of a flat bed without using bedrails?: A Little Help needed moving to and from a bed to a chair (including a wheelchair)?: A Little Help needed standing up from a chair using your arms (e.g., wheelchair or bedside chair)?: A Little Help needed to walk in hospital room?: A Little Help needed climbing 3-5 steps with a railing? : A Little 6 Click Score: 18    End of Session   Activity Tolerance: Patient tolerated treatment well Patient left: in bed;with call bell/phone within reach;with bed alarm set   PT Visit Diagnosis: Unsteadiness on feet (R26.81);History of falling (Z91.81);Repeated falls (R29.6);Muscle weakness (generalized) (M62.81)    Time: 1601-0932 PT Time Calculation (min) (ACUTE ONLY): 11 min   Charges:   PT Evaluation $PT Eval Moderate Complexity: 1 Mod          Doreatha Massed, PT Acute Rehabilitation Services

## 2019-06-27 NOTE — Evaluation (Signed)
Occupational Therapy Evaluation Patient Details Name: Nancy Franco MRN: [id number] DOB: Sep 08, 1940 Today's Date: 06/27/2019    History of Present Illness 78 yo female admitted with colitis, UTI, dizziness. Hx of A fib, vertigo, falls   Clinical Impression   Pt admitted with colitis. Pt currently with functional limitations due to the deficits listed below (see OT Problem List).  Pt will benefit from skilled OT to increase their safety and independence with ADL and functional mobility for ADL to facilitate discharge to venue listed below.      Follow Up Recommendations  Home health OT;Supervision/Assistance - 24 hour    Equipment Recommendations  None recommended by OT    Recommendations for Other Services       Precautions / Restrictions Precautions Precautions: Fall Restrictions Weight Bearing Restrictions: No      Mobility Bed Mobility Overal bed mobility: Needs Assistance Bed Mobility: Supine to Sit;Sit to Supine     Supine to sit: Min guard;HOB elevated Sit to supine: Min guard;HOB elevated      Transfers Overall transfer level: Needs assistance Equipment used: None Transfers: Sit to/from Stand Sit to Stand: Min assist         General transfer comment: Assist to steady. VCs hand placement.    Balance Overall balance assessment: Needs assistance;History of Falls         Standing balance support: Bilateral upper extremity supported Standing balance-Leahy Scale: Poor                             ADL either performed or assessed with clinical judgement   ADL Overall ADL's : Needs assistance/impaired Eating/Feeding: Sitting;Set up   Grooming: Set up;Sitting   Upper Body Bathing: Minimal assistance;Sitting   Lower Body Bathing: Minimal assistance;Sit to/from stand;Cueing for sequencing;Cueing for safety   Upper Body Dressing : Set up;Sitting   Lower Body Dressing: Minimal assistance;Sit to/from stand   Toilet Transfer: Minimal  assistance;Cueing for safety;Cueing for sequencing;BSC;Stand-pivot   Toileting- Clothing Manipulation and Hygiene: Minimal assistance;Sit to/from stand;Cueing for safety;Cueing for sequencing               Vision Baseline Vision/History: Wears glasses Patient Visual Report: No change from baseline              Pertinent Vitals/Pain Pain Assessment: Faces Pain Score: 3  Faces Pain Scale: Hurts little more Pain Location: R arm Pain Descriptors / Indicators: Discomfort;Sore Pain Intervention(s): Monitored during session;Repositioned     Hand Dominance     Extremity/Trunk Assessment Upper Extremity Assessment Upper Extremity Assessment: Generalized weakness   Lower Extremity Assessment Lower Extremity Assessment: Generalized weakness   Cervical / Trunk Assessment Cervical / Trunk Assessment: Normal   Communication Communication Communication: No difficulties   Cognition Arousal/Alertness: Awake/alert Behavior During Therapy: WFL for tasks assessed/performed Overall Cognitive Status: Within Functional Limits for tasks assessed                                                Home Living Family/patient expects to be discharged to:: Private residence Living Arrangements: Children Available Help at Discharge: Family Type of Home: House Home Access: Level entry     Home Layout: Two level Alternate Level Stairs-Number of Steps: 15   Bathroom Shower/Tub: Producer, television/film/video: Standard     Home Equipment:  Walker - 2 wheels;Cane - single point;Shower seat          Prior Functioning/Environment Level of Independence: Independent with assistive device(s)        Comments: has been using walker and cane since fall        OT Problem List: Decreased strength;Impaired balance (sitting and/or standing);Decreased activity tolerance      OT Treatment/Interventions: Self-care/ADL training;Patient/family education;DME and/or AE  instruction    OT Goals(Current goals can be found in the care plan section) Acute Rehab OT Goals Patient Stated Goal: to get better. OT Goal Formulation: With patient Time For Goal Achievement: 07/04/19 ADL Goals Pt Will Perform Grooming: with modified independence;standing Pt Will Perform Lower Body Dressing: with modified independence;sit to/from stand Pt Will Transfer to Toilet: with modified independence;ambulating Pt Will Perform Toileting - Clothing Manipulation and hygiene: with modified independence;sit to/from stand  OT Frequency: Min 2X/week    AM-PAC OT "6 Clicks" Daily Activity     Outcome Measure Help from another person eating meals?: None Help from another person taking care of personal grooming?: A Little Help from another person toileting, which includes using toliet, bedpan, or urinal?: A Little Help from another person bathing (including washing, rinsing, drying)?: A Little Help from another person to put on and taking off regular upper body clothing?: A Little Help from another person to put on and taking off regular lower body clothing?: A Little 6 Click Score: 19   End of Session Nurse Communication: Mobility status  Activity Tolerance: Patient limited by fatigue Patient left: in bed;with call bell/phone within reach;with bed alarm set  OT Visit Diagnosis: Unsteadiness on feet (R26.81);Muscle weakness (generalized) (M62.81);Other abnormalities of gait and mobility (R26.89);History of falling (Z91.81)                Time: 1034-1050 OT Time Calculation (min): 16 min Charges:  OT General Charges $OT Visit: 1 Visit OT Evaluation $OT Eval Moderate Complexity: 1 Mod  Kari Baars, [place] [phone] Office- [phone], Edwena Felty D 06/27/2019, 2:36 PM

## 2019-06-27 NOTE — Plan of Care (Signed)
Initiated care plan 

## 2019-06-27 NOTE — Progress Notes (Signed)
Initial Nutrition Assessment  RD working remotely.   DOCUMENTATION CODES:   Not applicable  INTERVENTION:  - will order Boost Breeze TID, each supplement provides 250 kcal and 9 grams of protein. - will order 30 mL Prostat BID, each supplement provides 100 kcal and 15 grams of protein. - will order daily multivitamin with minerals. - continue to encourage PO intakes and advance diet as medically feasible.    NUTRITION DIAGNOSIS:   Inadequate oral intake related to acute illness, nausea, other (see comment)(abdominal pain and constipation) as evidenced by per patient/family report.  GOAL:   Patient will meet greater than or equal to 90% of their needs  MONITOR:   PO intake, Supplement acceptance, Diet advancement, Labs, Weight trends  REASON FOR ASSESSMENT:   Consult Malnutrition Eval  ASSESSMENT:   78 y.o. female with medical history significant of a.fib. HLD, vertigo, and recurrent falls. She presented to the ED on 12/21 with 2 week history of constipation, nausea, lack of appetite, and severe abdominal pain. She was given IV fluids in the ED and was discharged home but returned to the ED on 12/22. She has hx of recurrent UTI that leads to urinary frequency. She last fell on 12/5 during which she injured her R shoulder. She has surgical hx of appendectomy and cholecystectomy.  Patient has been on CLD since admission and no intakes documented since that time. Per chart review, current weight is 154 lb and weight on 12/9 was 155 lb. Weight at [facility] on 05/15/19 was 157 lb. This indicates 3 lb weight loss (2% body weight) in 1.5 months; not significant for time frame.   Patient does not feel she has lost weight in the past 2-3 weeks. She reports that she has been experiencing severe constipation which has led to sharp abdominal pain and associated nausea. This has been occurring for ~2 weeks and has led to a significant decrease in appetite and lack of desire to even try to eat.  She does try to drink adequate fluids throughout the day, but has been having urinary frequency and feeling unable to keep up with fluid loss.   Per notes: - colitis--IV fluids and bowel rest - dehydration - leukocytosis--thought to be 2/2 colitis and hemoconcentration - acute lower UTI - likely PT and OT evaluation prior to discharge   Labs reviewed; CBG: 124 mg/dl, Ca: 8.3 mg/dl. Medications reviewed; 50 mcg oral synthroid/day.  IVF; NS @ 100 ml/hr.    NUTRITION - FOCUSED PHYSICAL EXAM:  unable to complete at this time.   Diet Order:   Diet Order            Diet clear liquid Room service appropriate? Yes; Fluid consistency: Thin  Diet effective now              EDUCATION NEEDS:   No education needs have been identified at this time  Skin:  Skin Assessment: Reviewed RN Assessment  Last BM:  12/21  Height:   Ht Readings from Last 1 Encounters:  06/25/19 5\' 3"  (1.6 m)    Weight:   Wt Readings from Last 1 Encounters:  06/25/19 70 kg    Ideal Body Weight:  52.3 kg  BMI:  Body mass index is 27.34 kg/m.  Estimated Nutritional Needs:   Kcal:  1750-1960 kcal  Protein:  80-90 grams  Fluid:  >/= 2 L/day      Jarome Matin, MS, RD, LDN, [facility] Inpatient Clinical Dietitian Pager # [phone] After hours/weekend pager # [phone]

## 2019-06-27 NOTE — Progress Notes (Signed)
PROGRESS NOTE    LEVON PENNING  QIH:[id number] DOB: [birth year] DOA: 06/25/2019 PCP: Patient, No Pcp Per  Brief Narrative:  HPI per Toy Baker on 06/26/2019 Kayren Eaves Benko is a 78 y.o. female with medical history significant of a.fib. HLD, Vertigo, falls    Presented with  2wk hx of Constipation, Nausea no appetite severe abdominal pain. She was seen yesterday in emergency department complaining of constipation for at least 2 weeks with signconstipationificant abdominal pain.  Patient was rehydrated and was able to be discharged to home. She returned to ER today reporting Nausea  abd pain.  She reports only symptoms of UTi is frequency of urination states she has frequent UTi but never have any dysuria. She has seen Urologist in the past. She is usually treated with antibiotics.  Reports recently she have had increased frequency of urination and had to get up at night to urinate.    She has been having nausea   and dry heaving for 2 weeks.  This has started after patient had recent fall on 06/09/2019 she injured her right shoulder was evaluated in the emergency department and was prescribed Vicodin.  She denies any history of fevers no chest pain or shortness of breath Patient is status post apendectomy and cholecystectomy.   Prior to starting Vicodin she was treating her pain with ibuprofen and Tylenol.  Patient is not on blood thinners secondary to recurrent falls  Infectious risk factors:  Reports N/V abdominal pain,        PCR testing   NEGATIVE    **Interim History  Patient's abdomen pain is improved however she is complaining of some nausea and some dizziness.  States that she does not like the clear liquid diet that is very bland.  Has not been eating much but states her pain is definitely improved.  Denies any other complaints and diet has been advanced to full liquid diet  Assessment & Plan:   Active Problems:   Colitis   Dehydration   Hypothyroidism   Hyperlipidemia   Essential hypertension   Leukocytosis   Acute lower UTI  Acute Colitis -CT Abdomen and Pelvis showed "Findings are most consistent with colitis involving the descending and sigmoid colon, with relative sparing of the distal sigmoid and rectum. Differential considerations include infectious, inflammatory, or ischemic etiologies.  Small 4 mm mural nodule in the second portion of the duodenum, may represent a small duodenal polyp. Consider evaluation with outpatient EGD.  Asymmetric elevation of the right hemidiaphragm, nonspecific.  Additional incidental and ancillary findings as detailed above." -Admit for IV fluids and IV antibiotics bowel rest. Currently on IV Ciprofloxacin and Metronidazole -Rehydrate and reassess  -Diet Advanced from Clear to FULL Liquid Diet -C/w NS but reduce rate from 100 mL/hr to 75 mL/hr -Abdominal Pain is improving   Dehydration  -Rehydrate and follow orthostatics in the morning  -Repeat CMP this AM   Hypothyroidism  -Checked TSH and was 1.053 -Continue home medications at current dose at Levothyroxine 50 mcg po Daily   Hyperlipidemia -Chronic continue home medications  Essential Hypertension  -Restarted metoprolol  -Continue to hold off on lisinopril/hydrochlorothiazide for tonight given dehydration restart when patient is back to baseline  Leukocytosis -Likely secondary to colitis and hemoconcentration -Improved WBC went from 15.9 -> 8.4 -Continue to Monitor S/Sx of Bleeding -Repeat CBC in AM   Acute lower UTI   -Urinalysis showed cloudy appearance with small hemoglobin, 15 ketones, negative leukocytes, positive nitrites, many bacteria, 0-5 RBCs per high-power  field, and 11-20 WBCs -She has frequency of urination typical for her prior presentation of UTI and she is currently on Cipro -Continue to await urine culture results -Continue to monitor   Hyperglycemia -Blood Sugar on Admission was 140 and 112 on CMP and repeat  this AM was 82 -CBG this AM was 125 -Check HbA1c -Continue to Monitor Blood Sugars carefully -If Necessary will place on Sensitive Novolog SSI  Hyperbilirubinemia -Patient's T Bili this AM was 2.0 -Likely reactive in the setting of Colitis -Continue to Monitor and Trend -Repeat CMP in AM and consider RUQ U/S  DVT prophylaxis: SCDs Code Status: FULL CODE Family Communication: No family present at bedside Disposition Plan: Pending Diet Tolerance   Consultants:   None  Procedures:  None   Antimicrobials:  Anti-infectives (From admission, onward)   Start     Dose/Rate Route Frequency Ordered Stop   06/26/19 1800  ciprofloxacin (CIPRO) IVPB 400 mg     400 mg 200 mL/hr over 60 Minutes Intravenous Every 12 hours 06/26/19 1548     06/26/19 1600  metroNIDAZOLE (FLAGYL) IVPB 500 mg     500 mg 100 mL/hr over 60 Minutes Intravenous Every 8 hours 06/26/19 1548     06/26/19 0145  ciprofloxacin (CIPRO) IVPB 400 mg     400 mg 200 mL/hr over 60 Minutes Intravenous  Once 06/26/19 0131 06/26/19 0246   06/26/19 0145  metroNIDAZOLE (FLAGYL) IVPB 500 mg     500 mg 100 mL/hr over 60 Minutes Intravenous  Once 06/26/19 0131 06/26/19 0358     Subjective: Seen examined at bedside and states that she is doing better from coming in as she had severe abdominal pain.  Continues to have some nausea and felt lightheaded this morning and dizzy.  No other concerns or points at this time but states she did not like the clear liquid diet very much.  Objective: Vitals:   06/26/19 1700 06/26/19 1900 06/26/19 2245 06/27/19 0549  BP: (!) 118/55 (!) 154/74 119/63 126/76  Pulse: 88 87 73 63  Resp: 15 17 17 17   Temp:  98.5 F (36.9 C) 98.1 F (36.7 C) 98.3 F (36.8 C)  TempSrc:  Oral Oral Oral  SpO2: 94% 96% 93% 95%  Weight:      Height:        Intake/Output Summary (Last 24 hours) at 06/27/2019 0751 Last data filed at 06/27/2019 0500 Gross per 24 hour  Intake 1182.57 ml  Output --  Net 1182.57  ml   Filed Weights   06/25/19 2156  Weight: 70 kg   Examination: Physical Exam:  Constitutional: WN/WD overweight Caucasian female in NAD and appears calm and comfortable Eyes: Lids and conjunctivae normal, sclerae anicteric  ENMT: External Ears, Nose appear normal. Grossly normal hearing.  Neck: Appears normal, supple, no cervical masses, normal ROM, no appreciable thyromegaly; no JVD Respiratory: Diminished to auscultation bilaterally, no wheezing, rales, rhonchi or crackles. Normal respiratory effort and patient is not tachypenic. No accessory muscle use.  Cardiovascular: RRR, no murmurs / rubs / gallops. S1 and S2 auscultated. No extremity edema.  Abdomen: Soft, mildly tender, Distended.  Bowel sounds positive x4.  GU: Deferred. Musculoskeletal: No clubbing / cyanosis of digits/nails. No joint deformity upper and lower extremities. Skin: No rashes, lesions, ulcers on a limited skin evaluation. No induration; Warm and dry.  Neurologic: CN 2-12 grossly intact with no focal deficits. Romberg sign and cerebellar reflexes not assessed.  Psychiatric: Normal judgment and insight. Alert and oriented x  3. Pleasant mood and appropriate affect.   Data Reviewed: I have personally reviewed following labs and imaging studies  CBC: Recent Labs  Lab 06/25/19 2337 06/26/19 2043 06/27/19 0325  WBC 15.9* 9.4 8.4  NEUTROABS 13.8* 6.7  --   HGB 16.5* 13.7 12.8  HCT 50.3* 42.8 40.8  MCV 96.5 98.2 101.2*  PLT 281 246 229   Basic Metabolic Panel: Recent Labs  Lab 06/26/19 0005 06/26/19 2043 06/27/19 0325  NA 137 137 138  K 4.6 3.7 4.1  CL 100 105 107  CO2 25 23 22   GLUCOSE 140* 112* 82  BUN 21 18 17   CREATININE 0.89 0.71 0.69  CALCIUM 9.2 8.6* 8.3*  MG  --   --  2.0  PHOS  --   --  3.1   GFR: Estimated Creatinine Clearance: 53.5 mL/min (by C-G formula based on SCr of 0.69 mg/dL). Liver Function Tests: Recent Labs  Lab 06/26/19 0005 06/26/19 2043 06/27/19 0325  AST 28 24 33    ALT 24 22 25   ALKPHOS 89 74 69  BILITOT 1.9* 2.0* 2.0*  PROT 7.0 6.3* 5.7*  ALBUMIN 3.7 3.1* 2.9*   Recent Labs  Lab 06/26/19 0005  LIPASE 18   No results for input(s): AMMONIA in the last 168 hours. Coagulation Profile: No results for input(s): INR, PROTIME in the last 168 hours. Cardiac Enzymes: No results for input(s): CKTOTAL, CKMB, CKMBINDEX, TROPONINI in the last 168 hours. BNP (last 3 results) No results for input(s): PROBNP in the last 8760 hours. HbA1C: No results for input(s): HGBA1C in the last 72 hours. CBG: Recent Labs  Lab 06/27/19 0558  GLUCAP 124*   Lipid Profile: No results for input(s): CHOL, HDL, LDLCALC, TRIG, CHOLHDL, LDLDIRECT in the last 72 hours. Thyroid Function Tests: Recent Labs    06/27/19 0325  TSH 1.053   Anemia Panel: No results for input(s): VITAMINB12, FOLATE, FERRITIN, TIBC, IRON, RETICCTPCT in the last 72 hours. Sepsis Labs: Recent Labs  Lab 06/26/19 2043  LATICACIDVEN 1.1    Recent Results (from the past 240 hour(s))  SARS CORONAVIRUS 2 (TAT 6-24 HRS) Nasopharyngeal Nasopharyngeal Swab     Status: None   Collection Time: 06/26/19  5:09 AM   Specimen: Nasopharyngeal Swab  Result Value Ref Range Status   SARS Coronavirus 2 NEGATIVE NEGATIVE Final    Comment: (NOTE) SARS-CoV-2 target nucleic acids are NOT DETECTED. The SARS-CoV-2 RNA is generally detectable in upper and lower respiratory specimens during the acute phase of infection. Negative results do not preclude SARS-CoV-2 infection, do not rule out co-infections with other pathogens, and should not be used as the sole basis for treatment or other patient management decisions. Negative results must be combined with clinical observations, patient history, and epidemiological information. The expected result is Negative. Fact Sheet for Patients: HairSlick.nohttps://www.fda.gov/media/138098/download Fact Sheet for Healthcare  Providers: quierodirigir.comhttps://www.fda.gov/media/138095/download This test is not yet approved or cleared by the [place] FDA and  has been authorized for detection and/or diagnosis of SARS-CoV-2 by FDA under an Emergency Use Authorization (EUA). This EUA will remain  in effect (meaning this test can be used) for the duration of the COVID-19 declaration under Section 56 4(b)(1) of the Act, 21 U.S.C. section 360bbb-3(b)(1), unless the authorization is terminated or revoked sooner. Performed at [facility], [address] [place], [place] [address]     Radiology Studies: CT Abdomen Pelvis W Contrast  Result Date: 06/26/2019 CLINICAL DATA:  Nausea, vomiting, bowel obstruction suspected skull EXAM: CT  ABDOMEN AND PELVIS WITH CONTRAST TECHNIQUE: Multidetector CT imaging of the abdomen and pelvis was performed using the standard protocol following bolus administration of intravenous contrast. CONTRAST:  OMNIPAQUE IOHEXOL 300 MG/ML  SOLN COMPARISON:  None. FINDINGS: Lower chest: Asymmetric elevation of the right hemidiaphragm, nonspecific. Some adjacent atelectatic changes noted. Additional bandlike opacities in the left lung base may reflect atelectasis or scarring. Cardiac size is within normal limits. Extensive coronary artery calcification is noted. 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 Trace pericardial fluid within normal limits. Hepatobiliary: No focal liver abnormality is seen. Patient is post cholecystectomy. Slight prominence of the biliary tree likely related to reservoir effect. No calcified intraductal gallstones. Pancreas: Unremarkable. No pancreatic ductal dilatation or surrounding inflammatory changes. Spleen: Normal in size without focal abnormality. Adrenals/Urinary Tract: Normal adrenal glands. 2.4 cm fluid attenuation cysts seen in the lower pole right kidney. No worrisome renal lesions. Kidneys enhance and excrete  symmetrically. No urolithiasis or hydronephrosis. Bladder is unremarkable. Stomach/Bowel: Distal esophagus and stomach are unremarkable. Small 4 mm mural nodule noted in the second portion of the duodenum (3/36). Distal duodenum take takes a normal course. No small bowel dilatation or wall thickening. The appendix is surgically absent. Interposition of the right colon anterior to the liver. There is extensive segmental thickening of the descending and sigmoid colon with pericolonic hazy stranding. There is relative sparing of the distal sigmoid and rectum. There are scattered colonic diverticula in this region but without identifiable culprit diverticulum. No extraluminal gas is seen. No organized abscess. No air within the draining veins. No convincing evidence of bowel obstruction. Vascular/Lymphatic: Atherosclerotic plaque within the normal caliber aorta. Additional calcifications in the branch vessels. Reactive adenopathy in the low abdomen. No pathologically enlarged nodes. Reproductive: Anteverted uterus with parametrial calcification. No concerning adnexal lesions. Other: Trace free fluid in the left hemipelvis, likely reactive. No free air. No bowel containing hernia. Musculoskeletal: Multilevel degenerative changes are present in the imaged portions of the spine. 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 Additional degenerative changes noted in the hips. No acute osseous abnormality or suspicious osseous lesion. IMPRESSION: 1. Findings are most consistent with colitis involving the descending and sigmoid colon, with relative sparing of the distal sigmoid and rectum. Differential considerations include infectious, inflammatory, or ischemic etiologies. 2. Small 4 mm mural nodule in the second portion of the duodenum, may represent a small duodenal polyp. Consider evaluation with outpatient EGD. 3. Asymmetric elevation of the right hemidiaphragm, nonspecific. 4. Additional incidental and  ancillary findings as detailed above. 5. Aortic Atherosclerosis (ICD10-I70.0). Electronically Signed   By: Kreg Shropshire M.D.   On: 06/26/2019 01:03   Scheduled Meds: . aspirin  81 mg Oral Daily  . atorvastatin  40 mg Oral Daily  . levothyroxine  50 mcg Oral QAC breakfast  . metoprolol succinate  50 mg Oral Daily   Continuous Infusions: . sodium chloride 100 mL/hr at 06/26/19 2121  . ciprofloxacin 400 mg (06/27/19 0537)  . metronidazole 500 mg (06/26/19 2346)    LOS: 0 days   Merlene Laughter, DO [facility] PAGER is on AMION  If 7PM-7AM, please contact night-coverage [email or web address]

## 2019-06-28 ENCOUNTER — Observation Stay (HOSPITAL_COMMUNITY): Payer: Medicare HMO

## 2019-06-28 DIAGNOSIS — R739 Hyperglycemia, unspecified: Secondary | ICD-10-CM | POA: Diagnosis present

## 2019-06-28 DIAGNOSIS — R296 Repeated falls: Secondary | ICD-10-CM | POA: Diagnosis present

## 2019-06-28 DIAGNOSIS — E872 Acidosis: Secondary | ICD-10-CM | POA: Diagnosis present

## 2019-06-28 DIAGNOSIS — R112 Nausea with vomiting, unspecified: Secondary | ICD-10-CM

## 2019-06-28 DIAGNOSIS — Z20828 Contact with and (suspected) exposure to other viral communicable diseases: Secondary | ICD-10-CM | POA: Diagnosis present

## 2019-06-28 DIAGNOSIS — R109 Unspecified abdominal pain: Secondary | ICD-10-CM | POA: Diagnosis not present

## 2019-06-28 DIAGNOSIS — Z9049 Acquired absence of other specified parts of digestive tract: Secondary | ICD-10-CM | POA: Diagnosis not present

## 2019-06-28 DIAGNOSIS — E876 Hypokalemia: Secondary | ICD-10-CM | POA: Diagnosis present

## 2019-06-28 DIAGNOSIS — E039 Hypothyroidism, unspecified: Secondary | ICD-10-CM | POA: Diagnosis present

## 2019-06-28 DIAGNOSIS — E785 Hyperlipidemia, unspecified: Secondary | ICD-10-CM | POA: Diagnosis present

## 2019-06-28 DIAGNOSIS — E7800 Pure hypercholesterolemia, unspecified: Secondary | ICD-10-CM | POA: Diagnosis present

## 2019-06-28 DIAGNOSIS — I10 Essential (primary) hypertension: Secondary | ICD-10-CM | POA: Diagnosis present

## 2019-06-28 DIAGNOSIS — K5900 Constipation, unspecified: Secondary | ICD-10-CM | POA: Diagnosis present

## 2019-06-28 DIAGNOSIS — N3000 Acute cystitis without hematuria: Secondary | ICD-10-CM | POA: Diagnosis present

## 2019-06-28 DIAGNOSIS — R32 Unspecified urinary incontinence: Secondary | ICD-10-CM | POA: Diagnosis present

## 2019-06-28 DIAGNOSIS — E860 Dehydration: Secondary | ICD-10-CM | POA: Diagnosis present

## 2019-06-28 DIAGNOSIS — K529 Noninfective gastroenteritis and colitis, unspecified: Secondary | ICD-10-CM | POA: Diagnosis present

## 2019-06-28 DIAGNOSIS — I4891 Unspecified atrial fibrillation: Secondary | ICD-10-CM | POA: Diagnosis present

## 2019-06-28 DIAGNOSIS — N390 Urinary tract infection, site not specified: Secondary | ICD-10-CM | POA: Diagnosis not present

## 2019-06-28 DIAGNOSIS — R17 Unspecified jaundice: Secondary | ICD-10-CM | POA: Diagnosis present

## 2019-06-28 DIAGNOSIS — Z79890 Hormone replacement therapy (postmenopausal): Secondary | ICD-10-CM | POA: Diagnosis not present

## 2019-06-28 LAB — COMPREHENSIVE METABOLIC PANEL
ALT: 23 U/L (ref 0–44)
AST: 27 U/L (ref 15–41)
Albumin: 3.2 g/dL — ABNORMAL LOW (ref 3.5–5.0)
Alkaline Phosphatase: 71 U/L (ref 38–126)
Anion gap: 12 (ref 5–15)
BUN: 8 mg/dL (ref 8–23)
CO2: 21 mmol/L — ABNORMAL LOW (ref 22–32)
Calcium: 8.3 mg/dL — ABNORMAL LOW (ref 8.9–10.3)
Chloride: 106 mmol/L (ref 98–111)
Creatinine, Ser: 0.56 mg/dL (ref 0.44–1.00)
GFR calc Af Amer: >60 mL/min (ref >60)
GFR calc non Af Amer: >60 mL/min (ref >60)
Glucose, Bld: 123 mg/dL — ABNORMAL HIGH (ref 70–99)
Potassium: 3.3 mmol/L — ABNORMAL LOW (ref 3.5–5.1)
Sodium: 139 mmol/L (ref 135–145)
Total Bilirubin: 1.7 mg/dL — ABNORMAL HIGH (ref 0.3–1.2)
Total Protein: 6.3 g/dL — ABNORMAL LOW (ref 6.5–8.1)

## 2019-06-28 LAB — CBC WITH DIFFERENTIAL/PLATELET
Abs Immature Granulocytes: 0.01 10*3/uL (ref 0.00–0.07)
Basophils Absolute: 0 10*3/uL (ref 0.0–0.1)
Basophils Relative: 1 %
Eosinophils Absolute: 0.2 10*3/uL (ref 0.0–0.5)
Eosinophils Relative: 2 %
HCT: 41.6 % (ref 36.0–46.0)
Hemoglobin: 13.5 g/dL (ref 12.0–15.0)
Immature Granulocytes: 0 %
Lymphocytes Relative: 12 %
Lymphs Abs: 0.9 10*3/uL (ref 0.7–4.0)
MCH: 31.8 pg (ref 26.0–34.0)
MCHC: 32.5 g/dL (ref 30.0–36.0)
MCV: 98.1 fL (ref 80.0–100.0)
Monocytes Absolute: 0.6 10*3/uL (ref 0.1–1.0)
Monocytes Relative: 8 %
Neutro Abs: 5.4 10*3/uL (ref 1.7–7.7)
Neutrophils Relative %: 77 %
Platelets: 213 10*3/uL (ref 150–400)
RBC: 4.24 MIL/uL (ref 3.87–5.11)
RDW: 13.2 % (ref 11.5–15.5)
WBC: 7 10*3/uL (ref 4.0–10.5)
nRBC: 0 % (ref 0.0–0.2)

## 2019-06-28 LAB — MAGNESIUM: Magnesium: 1.7 mg/dL (ref 1.7–2.4)

## 2019-06-28 LAB — PHOSPHORUS: Phosphorus: 2.7 mg/dL (ref 2.5–4.6)

## 2019-06-28 MED ORDER — SENNOSIDES-DOCUSATE SODIUM 8.6-50 MG PO TABS
1.0000 | ORAL_TABLET | Freq: Two times a day (BID) | ORAL | Status: DC
  Administered 2019-06-28 – 2019-07-01 (×5): 1 via ORAL
  Filled 2019-06-28 (×5): qty 1

## 2019-06-28 MED ORDER — POTASSIUM CHLORIDE 10 MEQ/100ML IV SOLN
10.0000 meq | INTRAVENOUS | Status: AC
  Administered 2019-06-29 (×3): 10 meq via INTRAVENOUS
  Filled 2019-06-28 (×3): qty 100

## 2019-06-28 MED ORDER — PROMETHAZINE HCL 25 MG/ML IJ SOLN
12.5000 mg | Freq: Four times a day (QID) | INTRAMUSCULAR | Status: DC | PRN
  Administered 2019-06-28: 12.5 mg via INTRAVENOUS
  Filled 2019-06-28: qty 1

## 2019-06-28 MED ORDER — POLYETHYLENE GLYCOL 3350 17 G PO PACK
17.0000 g | PACK | Freq: Two times a day (BID) | ORAL | Status: DC
  Administered 2019-06-28 – 2019-06-29 (×3): 17 g via ORAL
  Filled 2019-06-28 (×4): qty 1

## 2019-06-28 MED ORDER — MAGNESIUM SULFATE 2 GM/50ML IV SOLN
2.0000 g | Freq: Once | INTRAVENOUS | Status: AC
  Administered 2019-06-28: 2 g via INTRAVENOUS
  Filled 2019-06-28: qty 50

## 2019-06-28 MED ORDER — DEXTROSE-NACL 5-0.9 % IV SOLN: INTRAVENOUS | Status: DC

## 2019-06-28 NOTE — Progress Notes (Signed)
PT Cancellation Note  Patient Details Name: Nancy Franco MRN: [id number] DOB: 27-Oct-1940   Cancelled Treatment:     PT attempted but deferred at pt request 2* ongoing nausea.  Will follow.   Ginamarie Banfield 06/28/2019, 10:18 AM

## 2019-06-28 NOTE — Progress Notes (Signed)
PROGRESS NOTE    Nancy Franco  IWP:[id number] DOB: July 26, 1940 DOA: 06/25/2019 PCP: Patient, No Pcp Per  Brief Narrative:  HPI per [name] on 06/26/2019 Nancy Franco is a 78 y.o. female with medical history significant of a.fib. HLD, Vertigo, falls    Presented with  2wk hx of Constipation, Nausea no appetite severe abdominal pain. She was seen yesterday in emergency department complaining of constipation for at least 2 weeks with signconstipationificant abdominal pain.  Patient was rehydrated and was able to be discharged to home. She returned to ER today reporting Nausea  abd pain.  She reports only symptoms of UTi is frequency of urination states she has frequent UTi but never have any dysuria. She has seen Urologist in the past. She is usually treated with antibiotics.  Reports recently she have had increased frequency of urination and had to get up at night to urinate.    She has been having nausea   and dry heaving for 2 weeks.  This has started after patient had recent fall on 06/09/2019 she injured her right shoulder was evaluated in the emergency department and was prescribed Vicodin.  She denies any history of fevers no chest pain or shortness of breath Patient is status post apendectomy and cholecystectomy.   Prior to starting Vicodin she was treating her pain with ibuprofen and Tylenol.  Patient is not on blood thinners secondary to recurrent falls  Infectious risk factors:  Reports N/V abdominal pain,        PCR testing   NEGATIVE    **Interim History  Patient's abdomen pain is improved however she is complaining of some nausea and some dizziness again today and states that she has not had a bowel movement.  Was doing well up until last night and states that she became nauseous and dizzy again.  Attempted to eat a full liquid diet but nausea prohibits this.  Has not been eating much and feels fatigued.  Assessment & Plan:   Active Problems:  Colitis   Dehydration   Hypothyroidism   Hyperlipidemia   Essential hypertension   Leukocytosis   Acute lower UTI  Acute Colitis -CT Abdomen and Pelvis showed "Findings are most consistent with colitis involving the descending and sigmoid colon, with relative sparing of the distal sigmoid and rectum. Differential considerations include infectious, inflammatory, or ischemic etiologies.  Small 4 mm mural nodule in the second portion of the duodenum, may represent a small duodenal polyp. Consider evaluation with outpatient EGD.  Asymmetric elevation of the right hemidiaphragm, nonspecific.  Additional incidental and ancillary findings as detailed above." -Admit for IV fluids and IV antibiotics bowel rest. Currently on IV Ciprofloxacin and Metronidazole -Rehydrate and reassess  -Diet Advanced from Clear to FULL Liquid Diet and will leave it full liquid diet -Change IV fluids to D5 normal saline at 75 mL's per hour -Repeat KUB -Added Phenergan -Abdominal Pain is improving   Dehydration  -Rehydrate as above and follow orthostatics in the morning  -Repeat CMP this AM   Hypothyroidism  -Checked TSH and was 1.053 -Continue home medications at current dose at Levothyroxine 50 mcg po Daily   Hyperlipidemia -Chronic continue home medications  Essential Hypertension  -Restarted metoprolol  -Continue to hold off on lisinopril/hydrochlorothiazide for tonight given dehydration restart when patient is back to baseline  Leukocytosis -Likely secondary to colitis and hemoconcentration -Improved WBC went from 15.9 -> 8.4 -> 7.0 -Continue to Monitor S/Sx of Bleeding -Repeat CBC in AM   Acute  lower UTI   -Urinalysis showed cloudy appearance with small hemoglobin, 15 ketones, negative leukocytes, positive nitrites, many bacteria, 0-5 RBCs per high-power field, and 11-20 WBCs -She has frequency of urination typical for her prior presentation of UTI and she is currently on Cipro -Continue to  await urine culture results -Continue to monitor   Hyperglycemia -Blood Sugar on Admission was 140 and 112 on CMP and repeat this AM was 123 -Check HbA1c -Continue to Monitor Blood Sugars carefully -If Necessary will place on Sensitive Novolog SSI  Hyperbilirubinemia, mildly improved -Patient's T Bili this AM was 1.7 and down from 2.0 -Likely reactive in the setting of Colitis -Continue to Monitor and Trend -Repeat CMP in AM and consider RUQ U/S  Mild metabolic acidosis  -Patient CO2 was 21, chloride level was 106, anion gap was 12 -Change IV fluid hydration as above -May need bicarbonate We will continue to monitor and trend repeat CMP in a.m.  Hypokalemia -Replete -Continue monitor replete as necessary -Repeat potassium level in a.m.  DVT prophylaxis: SCDs Code Status: FULL CODE Family Communication: No family present at bedside Disposition Plan: Pending Diet Tolerance   Consultants:   None  Procedures:  None   Antimicrobials:  Anti-infectives (From admission, onward)   Start     Dose/Rate Route Frequency Ordered Stop   06/26/19 1800  ciprofloxacin (CIPRO) IVPB 400 mg     400 mg 200 mL/hr over 60 Minutes Intravenous Every 12 hours 06/26/19 1548     06/26/19 1600  metroNIDAZOLE (FLAGYL) IVPB 500 mg     500 mg 100 mL/hr over 60 Minutes Intravenous Every 8 hours 06/26/19 1548     06/26/19 0145  ciprofloxacin (CIPRO) IVPB 400 mg     400 mg 200 mL/hr over 60 Minutes Intravenous  Once 06/26/19 0131 06/26/19 0246   06/26/19 0145  metroNIDAZOLE (FLAGYL) IVPB 500 mg     500 mg 100 mL/hr over 60 Minutes Intravenous  Once 06/26/19 0131 06/26/19 0358     Subjective: Seen examined at bedside and states that she is not feeling well and states that she was significantly nauseous and dizzy.  Did not really attempt to eat today because of the dizziness.  Did not work with therapy other.  No other concerns or complaints at this time and just does not feel well and states  that she has not had a proper bowel movement in a few days.  Objective: Vitals:   06/28/19 0554 06/28/19 0902 06/28/19 0947 06/28/19 1616  BP: (!) 155/69 (!) 148/77 (!) 164/76 (!) 173/71  Pulse: 65 70 71 72  Resp: 16  16 17   Temp: 98.4 F (36.9 C)  98.2 F (36.8 C) 97.9 F (36.6 C)  TempSrc: Oral  Oral Oral  SpO2: 94%  95% 94%  Weight:      Height:        Intake/Output Summary (Last 24 hours) at 06/28/2019 2010 Last data filed at 06/28/2019 1840 Gross per 24 hour  Intake 1760.24 ml  Output 1950 ml  Net -189.76 ml   Filed Weights   06/25/19 2156  Weight: 70 kg   Examination: Physical Exam:  Constitutional: WN/WD overweight Caucasian female currently in some distress and appears uncomfortable  Eyes: Lids and conjunctivae normal, sclerae anicteric  ENMT: External Ears, Nose appear normal. Grossly normal hearing.  Neck: Appears normal, supple, no cervical masses, normal ROM, no appreciable thyromegaly; no JVD Respiratory: Diminished to auscultation bilaterally, no wheezing, rales, rhonchi or crackles. Normal respiratory effort and  patient is not tachypenic. No accessory muscle use.  Unlabored breathing Cardiovascular: RRR, no murmurs / rubs / gallops. S1 and S2 auscultated. No extremity edema.  Abdomen: Soft, a little tender, Distended. Bowel sounds positive.  GU: Deferred. Musculoskeletal: No clubbing / cyanosis of digits/nails. No joint deformity upper and lower extremities.  Skin: No rashes, lesions, ulcers on limited skin evaluation. No induration; Warm and dry.  Neurologic: CN 2-12 grossly intact with no focal deficits. Romberg sign and cerebellar reflexes not assessed.  Psychiatric: Normal judgment and insight. Alert and oriented x 3.  Depressed appearing mood and slight flat affect today  Data Reviewed: I have personally reviewed following labs and imaging studies  CBC: Recent Labs  Lab 06/25/19 2337 06/26/19 2043 06/27/19 0325 06/28/19 1041  WBC 15.9* 9.4 8.4  7.0  NEUTROABS 13.8* 6.7  --  5.4  HGB 16.5* 13.7 12.8 13.5  HCT 50.3* 42.8 40.8 41.6  MCV 96.5 98.2 101.2* 98.1  PLT 281 246 229 254   Basic Metabolic Panel: Recent Labs  Lab 06/26/19 0005 06/26/19 2043 06/27/19 0325 06/28/19 1041  NA 137 137 138 139  K 4.6 3.7 4.1 3.3*  CL 100 105 107 106  CO2 25 23 22  21*  GLUCOSE 140* 112* 82 123*  BUN 21 18 17 8   CREATININE 0.89 0.71 0.69 0.56  CALCIUM 9.2 8.6* 8.3* 8.3*  MG  --   --  2.0 1.7  PHOS  --   --  3.1 2.7   GFR: Estimated Creatinine Clearance: 53.5 mL/min (by C-G formula based on SCr of 0.56 mg/dL). Liver Function Tests: Recent Labs  Lab 06/26/19 0005 06/26/19 2043 06/27/19 0325 06/28/19 1041  AST 28 24 33 27  ALT 24 22 25 23   ALKPHOS 89 74 69 71  BILITOT 1.9* 2.0* 2.0* 1.7*  PROT 7.0 6.3* 5.7* 6.3*  ALBUMIN 3.7 3.1* 2.9* 3.2*   Recent Labs  Lab 06/26/19 0005  LIPASE 18   No results for input(s): AMMONIA in the last 168 hours. Coagulation Profile: No results for input(s): INR, PROTIME in the last 168 hours. Cardiac Enzymes: No results for input(s): CKTOTAL, CKMB, CKMBINDEX, TROPONINI in the last 168 hours. BNP (last 3 results) No results for input(s): PROBNP in the last 8760 hours. HbA1C: No results for input(s): HGBA1C in the last 72 hours. CBG: Recent Labs  Lab 06/27/19 0558  GLUCAP 124*   Lipid Profile: No results for input(s): CHOL, HDL, LDLCALC, TRIG, CHOLHDL, LDLDIRECT in the last 72 hours. Thyroid Function Tests: Recent Labs    06/27/19 0325  TSH 1.053   Anemia Panel: No results for input(s): VITAMINB12, FOLATE, FERRITIN, TIBC, IRON, RETICCTPCT in the last 72 hours. Sepsis Labs: Recent Labs  Lab 06/26/19 2043  LATICACIDVEN 1.1    Recent Results (from the past 240 hour(s))  Urine culture     Status: None   Collection Time: 06/26/19  3:34 AM   Specimen: Urine, Catheterized  Result Value Ref Range Status   Specimen Description   Final    URINE, CATHETERIZED Performed at [facility], [address] [place], [place] [address]    Special Requests   Final    NONE Performed at [facility], [address] [place], [place] [address]    Culture   Final    NO GROWTH Performed at [facility], [place] [address] [place],  [address]    Report Status 06/27/2019 FINAL  Final  SARS CORONAVIRUS 2 (  TAT 6-24 HRS) Nasopharyngeal Nasopharyngeal Swab     Status: None   Collection Time: 06/26/19  5:09 AM   Specimen: Nasopharyngeal Swab  Result Value Ref Range Status   SARS Coronavirus 2 NEGATIVE NEGATIVE Final    Comment: (NOTE) SARS-CoV-2 target nucleic acids are NOT DETECTED. The SARS-CoV-2 RNA is generally detectable in upper and lower respiratory specimens during the acute phase of infection. Negative results do not preclude SARS-CoV-2 infection, do not rule out co-infections with other pathogens, and should not be used as the sole basis for treatment or other patient management decisions. Negative results must be combined with clinical observations, patient history, and epidemiological information. The expected result is Negative. Fact Sheet for Patients: HairSlick.no Fact Sheet for Healthcare Providers: quierodirigir.com This test is not yet approved or cleared by the [place] FDA and  has been authorized for detection and/or diagnosis of SARS-CoV-2 by FDA under an Emergency Use Authorization (EUA). This EUA will remain  in effect (meaning this test can be used) for the duration of the COVID-19 declaration under Section 56 4(b)(1) of the Act, 21 U.S.C. section 360bbb-3(b)(1), unless the authorization is terminated or revoked sooner. Performed at [facility], [address] [place], [place] [address]     Radiology Studies: DG Abd 1 View  Result Date: 06/28/2019 CLINICAL DATA:  Generalized lower abdominal pain and nausea today. EXAM: ABDOMEN - 1 VIEW  COMPARISON:  CT 06/26/2019 FINDINGS: Bowel gas pattern is nonobstructive. No free peritoneal air. Mild degenerate change of the spine and hips. IMPRESSION: Nonobstructive bowel gas pattern. Electronically Signed   By: Elberta Fortis M.D.   On: 06/28/2019 10:27   Scheduled Meds: . aspirin  81 mg Oral Daily  . atorvastatin  40 mg Oral Daily  . feeding supplement  1 Container Oral TID BM  . feeding supplement (PRO-STAT SUGAR FREE 64)  30 mL Oral BID  . levothyroxine  50 mcg Oral QAC breakfast  . metoprolol succinate  50 mg Oral Daily  . multivitamin with minerals  1 tablet Oral Daily  . polyethylene glycol  17 g Oral BID  . senna-docusate  1 tablet Oral BID   Continuous Infusions: . ciprofloxacin 400 mg (06/28/19 1837)  . dextrose 5 % and 0.9% NaCl 75 mL/hr at 06/28/19 1330  . metronidazole 100 mL/hr at 06/28/19 1700    LOS: 0 days   Merlene Laughter, DO [facility] PAGER is on AMION  If 7PM-7AM, please contact night-coverage [email or web address]

## 2019-06-28 NOTE — Progress Notes (Signed)
OT Cancellation Note  Patient Details Name: Nancy Franco MRN: [id number] DOB: [birth year]   Cancelled Treatment:    Reason Eval/Treat Not Completed: Other (comment)  Pt not feeling well this day. Will check on pt as schedule allows  Kari Baars, [place] [phone] Office- [phone], Edwena Felty D 06/28/2019, 12:12 PM

## 2019-06-29 LAB — CBC WITH DIFFERENTIAL/PLATELET
Abs Immature Granulocytes: 0.03 10*3/uL (ref 0.00–0.07)
Basophils Absolute: 0 10*3/uL (ref 0.0–0.1)
Basophils Relative: 1 %
Eosinophils Absolute: 0.1 10*3/uL (ref 0.0–0.5)
Eosinophils Relative: 1 %
HCT: 44.3 % (ref 36.0–46.0)
Hemoglobin: 14.7 g/dL (ref 12.0–15.0)
Immature Granulocytes: 0 %
Lymphocytes Relative: 13 %
Lymphs Abs: 1 10*3/uL (ref 0.7–4.0)
MCH: 31.7 pg (ref 26.0–34.0)
MCHC: 33.2 g/dL (ref 30.0–36.0)
MCV: 95.7 fL (ref 80.0–100.0)
Monocytes Absolute: 0.8 10*3/uL (ref 0.1–1.0)
Monocytes Relative: 10 %
Neutro Abs: 6.2 10*3/uL (ref 1.7–7.7)
Neutrophils Relative %: 75 %
Platelets: 232 10*3/uL (ref 150–400)
RBC: 4.63 MIL/uL (ref 3.87–5.11)
RDW: 13.2 % (ref 11.5–15.5)
WBC: 8.2 10*3/uL (ref 4.0–10.5)
nRBC: 0 % (ref 0.0–0.2)

## 2019-06-29 LAB — COMPREHENSIVE METABOLIC PANEL
ALT: 20 U/L (ref 0–44)
AST: 27 U/L (ref 15–41)
Albumin: 3.2 g/dL — ABNORMAL LOW (ref 3.5–5.0)
Alkaline Phosphatase: 71 U/L (ref 38–126)
Anion gap: 12 (ref 5–15)
BUN: <5 mg/dL — ABNORMAL LOW (ref 8–23)
CO2: 23 mmol/L (ref 22–32)
Calcium: 8.5 mg/dL — ABNORMAL LOW (ref 8.9–10.3)
Chloride: 102 mmol/L (ref 98–111)
Creatinine, Ser: 0.57 mg/dL (ref 0.44–1.00)
GFR calc Af Amer: >60 mL/min (ref >60)
GFR calc non Af Amer: >60 mL/min (ref >60)
Glucose, Bld: 114 mg/dL — ABNORMAL HIGH (ref 70–99)
Potassium: 3.3 mmol/L — ABNORMAL LOW (ref 3.5–5.1)
Sodium: 137 mmol/L (ref 135–145)
Total Bilirubin: 1.5 mg/dL — ABNORMAL HIGH (ref 0.3–1.2)
Total Protein: 6.8 g/dL (ref 6.5–8.1)

## 2019-06-29 LAB — MAGNESIUM: Magnesium: 2 mg/dL (ref 1.7–2.4)

## 2019-06-29 LAB — PHOSPHORUS: Phosphorus: 2.7 mg/dL (ref 2.5–4.6)

## 2019-06-29 LAB — HEMOGLOBIN A1C
Hgb A1c MFr Bld: 5.6 % (ref 4.8–5.6)
Mean Plasma Glucose: 114.02 mg/dL

## 2019-06-29 MED ORDER — DILTIAZEM HCL-DEXTROSE 125-5 MG/125ML-% IV SOLN (PREMIX)
5.0000 mg/h | INTRAVENOUS | Status: DC
  Administered 2019-06-29: 5 mg/h via INTRAVENOUS
  Filled 2019-06-29: qty 125

## 2019-06-29 MED ORDER — DILTIAZEM LOAD VIA INFUSION
20.0000 mg | Freq: Once | INTRAVENOUS | Status: AC
  Administered 2019-06-29: 20 mg via INTRAVENOUS
  Filled 2019-06-29: qty 20

## 2019-06-29 MED ORDER — POTASSIUM CHLORIDE CRYS ER 20 MEQ PO TBCR
40.0000 meq | EXTENDED_RELEASE_TABLET | Freq: Once | ORAL | Status: AC
  Administered 2019-06-29: 40 meq via ORAL
  Filled 2019-06-29: qty 2

## 2019-06-29 NOTE — Progress Notes (Signed)
Pt HR 152 Afib, BP 132/81, Resp 20, oxygen saturations 94% on RA. Pt complains of flutter. EKG performed. MD notified. Will continue to monitor.

## 2019-06-29 NOTE — Progress Notes (Signed)
Pt HR 142 Afib, BP 140/84, oxygen saturations 96% on RA. Notified MD. Will continue to monitor.

## 2019-06-29 NOTE — Progress Notes (Signed)
Physical Therapy Treatment Patient Details Name: Nancy Franco MRN: [id number] DOB: Sep 08, 1940 Today's Date: 06/29/2019    History of Present Illness 78 yo female admitted with colitis, UTI, dizziness. Hx of A fib, vertigo, falls    PT Comments    Pt expressing FOF 2* weakness and requiring ++encouragement to participate.  Pt unsteady on feet but with noted improvement with increased distance and pt able to ambulate 200' in halls with min assist.  Follow Up Recommendations  Supervision/Assistance - 24 hour;Home health PT     Equipment Recommendations  None recommended by PT    Recommendations for Other Services       Precautions / Restrictions Precautions Precautions: Fall Restrictions Weight Bearing Restrictions: No    Mobility  Bed Mobility Overal bed mobility: Needs Assistance Bed Mobility: Supine to Sit     Supine to sit: Min guard;HOB elevated     General bed mobility comments: use of bed rail to self assist  Transfers Overall transfer level: Needs assistance Equipment used: Rolling walker (2 wheeled) Transfers: Sit to/from Stand Sit to Stand: Min assist         General transfer comment: Assist to steady. VCs hand placement.  Ambulation/Gait Ambulation/Gait assistance: Min assist Gait Distance (Feet): 200 Feet Assistive device: Rolling walker (2 wheeled);IV Pole Gait Pattern/deviations: Step-through pattern;Decreased step length - right;Decreased step length - left;Shuffle;Wide base of support     General Gait Details: 2' with RW and additional 100' with hand on IV pole.  Pt with general instability improved with increased distance ambulated   Stairs             Wheelchair Mobility    Modified Rankin (Stroke Patients Only)       Balance Overall balance assessment: Needs assistance;History of Falls Sitting-balance support: No upper extremity supported;Feet supported Sitting balance-Leahy Scale: Good     Standing balance support:  Single extremity supported Standing balance-Leahy Scale: Poor                              Cognition Arousal/Alertness: Awake/alert Behavior During Therapy: WFL for tasks assessed/performed Overall Cognitive Status: Within Functional Limits for tasks assessed                                        Exercises      General Comments        Pertinent Vitals/Pain Pain Assessment: No/denies pain    Home Living                      Prior Function            PT Goals (current goals can now be found in the care plan section) Acute Rehab PT Goals Patient Stated Goal: to get better. PT Goal Formulation: With patient Time For Goal Achievement: 07/12/19 Potential to Achieve Goals: Good Progress towards PT goals: Progressing toward goals    Frequency    Min 3X/week      PT Plan Current plan remains appropriate    Co-evaluation              AM-PAC PT "6 Clicks" Mobility   Outcome Measure  Help needed turning from your back to your side while in a flat bed without using bedrails?: A Little Help needed moving from lying on your back to sitting on  the side of a flat bed without using bedrails?: A Little Help needed moving to and from a bed to a chair (including a wheelchair)?: A Little Help needed standing up from a chair using your arms (e.g., wheelchair or bedside chair)?: A Little Help needed to walk in hospital room?: A Little Help needed climbing 3-5 steps with a railing? : A Little 6 Click Score: 18    End of Session Equipment Utilized During Treatment: Gait belt Activity Tolerance: Patient tolerated treatment well Patient left: in chair;with call bell/phone within reach;with chair alarm set Nurse Communication: Mobility status PT Visit Diagnosis: Unsteadiness on feet (R26.81);History of falling (Z91.81);Repeated falls (R29.6);Muscle weakness (generalized) (M62.81)     Time: 5170-0174 PT Time Calculation (min) (ACUTE  ONLY): 29 min  Charges:  $Gait Training: 23-37 mins                     [place] Pager [phone] Office [phone]    Shavone Nevers 06/29/2019, 5:02 PM

## 2019-06-29 NOTE — Progress Notes (Signed)
PROGRESS NOTE  Nancy Franco  DOB: 1941/03/05  PCP: Patient, No Pcp Per HBZ:[id number]  DOA: 06/25/2019  LOS: 1 day   Chief Complaint  Patient presents with  . Constipation   Brief narrative: Nancy A Orourkeis a 78 y.o.femalewith medical history significant of a.fib. HLD, Vertigo, falls Patient presented to the ED on 12/21 with 2-week history ofconstipation, nausea, no appetite and severe abdominal pain. 12/20, patient was was seen in ED for 2 weeks history of constipation and abdominal pain.  She was rehydrated and discharged. 12/21, patient returned back to ED. She also reported urinary frequency. In the ED, CT Abdomen and Pelvis showed findings consistent with colitis involving the descending and sigmoid colon, with relative sparing of the distal sigmoid and rectum.  Covid PCR negative.  Subjective: Patient was seen and examined this morning.  Pleasant elderly Caucasian female.  Lying down in bed.  Not in distress.  Wants to try some solid food.  Bowel movement normal.  Assessment/Plan: Acute Colitis -Present with constipation, abdominal pain, nausea -CT scan finding as above showing acute colitis involving descending and sigmoid colon. -Admitted and treated with IV ciprofloxacin, IV Flagyl, IV fluids.   -Diet Advanced to soft diet this morning. -Repeat x-ray 12/24 showed nonobstructive bowel gas pattern. -Feels better this morning.  If tolerates diet and no recurrence of symptoms, plan to discharge him tomorrow.  Acute lower UTI -Urinalysis showed cloudy appearance with small hemoglobin, 15 ketones, negative leukocytes, positive nitrites, many bacteria, 0-5 RBCs per high-power field, and 11-20 WBCs -She has frequency of urination typical for her prior presentation of UTI and she is currently on Cipro -Urine culture negative.  Hypokalemia -Potassium low at 3.3 today.  40 mEq oral potassium replacement ordered.  Hypothyroidism  -Checked TSH and was  1.053 -Continue home medications at current dose at Levothyroxine 50 mcg po Daily   Hyperlipidemia -Chronic continue home medications  Essential Hypertension -Continue metoprolol. -Blood pressure elevated to 150s.  Resume lisinopril/HCTZ today.  Hyperglycemia -Obtain A1c.  Mobility: Pending PT eval Diet: Soft diet today Fluid: Current D5 NS at 75 mL/h. DVT prophylaxis:  SCDs Code Status:  Full code Family Communication:  None at bedside Expected Discharge:  Hopefully home tomorrow  Consultants:  None  Procedures:  None  Antimicrobials: Anti-infectives (From admission, onward)   Start     Dose/Rate Route Frequency Ordered Stop   06/26/19 1800  ciprofloxacin (CIPRO) IVPB 400 mg     400 mg 200 mL/hr over 60 Minutes Intravenous Every 12 hours 06/26/19 1548     06/26/19 1600  metroNIDAZOLE (FLAGYL) IVPB 500 mg     500 mg 100 mL/hr over 60 Minutes Intravenous Every 8 hours 06/26/19 1548     06/26/19 0145  ciprofloxacin (CIPRO) IVPB 400 mg     400 mg 200 mL/hr over 60 Minutes Intravenous  Once 06/26/19 0131 06/26/19 0246   06/26/19 0145  metroNIDAZOLE (FLAGYL) IVPB 500 mg     500 mg 100 mL/hr over 60 Minutes Intravenous  Once 06/26/19 0131 06/26/19 0358        Code Status: Full Code   Diet Order            DIET SOFT Room service appropriate? Yes; Fluid consistency: Thin  Diet effective now              Infusions:  . ciprofloxacin 400 mg (06/29/19 0659)  . dextrose 5 % and 0.9% NaCl 75 mL/hr at 06/29/19 1131  . metronidazole 500 mg (06/29/19  1135)    Scheduled Meds: . aspirin  81 mg Oral Daily  . atorvastatin  40 mg Oral Daily  . feeding supplement  1 Container Oral TID BM  . feeding supplement (PRO-STAT SUGAR FREE 64)  30 mL Oral BID  . levothyroxine  50 mcg Oral QAC breakfast  . metoprolol succinate  50 mg Oral Daily  . multivitamin with minerals  1 tablet Oral Daily  . polyethylene glycol  17 g Oral BID  . senna-docusate  1 tablet Oral BID     PRN meds: acetaminophen **OR** acetaminophen, fentaNYL (SUBLIMAZE) injection, HYDROcodone-acetaminophen, meclizine, ondansetron **OR** ondansetron (ZOFRAN) IV, promethazine   Objective: Vitals:   06/29/19 0435 06/29/19 1137  BP: (!) 158/81 140/84  Pulse: 82 (!) 142  Resp: 18   Temp: 98.5 F (36.9 C)   SpO2: 96% 96%    Intake/Output Summary (Last 24 hours) at 06/29/2019 1351 Last data filed at 06/29/2019 0600 Gross per 24 hour  Intake 1356.15 ml  Output 2250 ml  Net -893.85 ml   Filed Weights   06/25/19 2156  Weight: 70 kg   Weight change:  Body mass index is 27.34 kg/m.   Physical Exam: General exam: Appears calm and comfortable.  Skin: No rashes, lesions or ulcers. HEENT: Atraumatic, normocephalic, supple neck, no obvious bleeding Lungs: Clear to auscultation bilaterally CVS: Regular rate and rhythm, no murmur GI/Abd soft, nontender, nondistended, bowel sound present CNS: Alert, awake, oriented x3 Psychiatry: Mood appropriate Extremities: No pedal edema, no calf tenderness  Data Review: I have personally reviewed the laboratory data and studies available.  Recent Labs  Lab 06/25/19 2337 06/26/19 2043 06/27/19 0325 06/28/19 1041 06/29/19 0510  WBC 15.9* 9.4 8.4 7.0 8.2  NEUTROABS 13.8* 6.7  --  5.4 6.2  HGB 16.5* 13.7 12.8 13.5 14.7  HCT 50.3* 42.8 40.8 41.6 44.3  MCV 96.5 98.2 101.2* 98.1 95.7  PLT 281 246 229 213 232   Recent Labs  Lab 06/26/19 0005 06/26/19 2043 06/27/19 0325 06/28/19 1041 06/29/19 0510  NA 137 137 138 139 137  K 4.6 3.7 4.1 3.3* 3.3*  CL 100 105 107 106 102  CO2 25 23 22  21* 23  GLUCOSE 140* 112* 82 123* 114*  BUN 21 18 17 8  <5*  CREATININE 0.89 0.71 0.69 0.56 0.57  CALCIUM 9.2 8.6* 8.3* 8.3* 8.5*  MG  --   --  2.0 1.7 2.0  PHOS  --   --  3.1 2.7 2.7    , MD  [facility] 06/29/2019

## 2019-06-30 LAB — CBC WITH DIFFERENTIAL/PLATELET
Abs Immature Granulocytes: 0.01 10*3/uL (ref 0.00–0.07)
Basophils Absolute: 0.1 10*3/uL (ref 0.0–0.1)
Basophils Relative: 1 %
Eosinophils Absolute: 0.1 10*3/uL (ref 0.0–0.5)
Eosinophils Relative: 2 %
HCT: 42.1 % (ref 36.0–46.0)
Hemoglobin: 13.5 g/dL (ref 12.0–15.0)
Immature Granulocytes: 0 %
Lymphocytes Relative: 15 %
Lymphs Abs: 1 10*3/uL (ref 0.7–4.0)
MCH: 31.3 pg (ref 26.0–34.0)
MCHC: 32.1 g/dL (ref 30.0–36.0)
MCV: 97.5 fL (ref 80.0–100.0)
Monocytes Absolute: 0.7 10*3/uL (ref 0.1–1.0)
Monocytes Relative: 10 %
Neutro Abs: 4.8 10*3/uL (ref 1.7–7.7)
Neutrophils Relative %: 72 %
Platelets: 232 10*3/uL (ref 150–400)
RBC: 4.32 MIL/uL (ref 3.87–5.11)
RDW: 13.4 % (ref 11.5–15.5)
WBC: 6.7 10*3/uL (ref 4.0–10.5)
nRBC: 0 % (ref 0.0–0.2)

## 2019-06-30 LAB — BASIC METABOLIC PANEL
Anion gap: 9 (ref 5–15)
BUN: 7 mg/dL — ABNORMAL LOW (ref 8–23)
CO2: 22 mmol/L (ref 22–32)
Calcium: 8.1 mg/dL — ABNORMAL LOW (ref 8.9–10.3)
Chloride: 106 mmol/L (ref 98–111)
Creatinine, Ser: 0.63 mg/dL (ref 0.44–1.00)
GFR calc Af Amer: >60 mL/min (ref >60)
GFR calc non Af Amer: >60 mL/min (ref >60)
Glucose, Bld: 156 mg/dL — ABNORMAL HIGH (ref 70–99)
Potassium: 3.7 mmol/L (ref 3.5–5.1)
Sodium: 137 mmol/L (ref 135–145)

## 2019-06-30 NOTE — Progress Notes (Signed)
Physical Therapy Treatment Patient Details Name: Nancy Franco MRN: [id number] DOB: 12-Jul-1940 Today's Date: 06/30/2019    History of Present Illness 78 yo female admitted with colitis, UTI, dizziness. Hx of A fib, vertigo, falls    PT Comments    Pt anxious regarding activity but with encouragement up to ambulate in halls.  Pt demonstrating increased activity tolerance as well as noted improvement in ambulatory stability.  Max HR noted 107 with O2 sats 94%+.   Follow Up Recommendations  Supervision/Assistance - 24 hour;Home health PT     Equipment Recommendations  None recommended by PT    Recommendations for Other Services       Precautions / Restrictions Precautions Precautions: Fall Restrictions Weight Bearing Restrictions: No    Mobility  Bed Mobility Overal bed mobility: Modified Independent Bed Mobility: Supine to Sit;Sit to Supine     Supine to sit: Modified independent (Device/Increase time) Sit to supine: Modified independent (Device/Increase time)   General bed mobility comments: no physical assist  Transfers Overall transfer level: Needs assistance Equipment used: None Transfers: Sit to/from Stand Sit to Stand: Min guard         General transfer comment: for pt confidence  Ambulation/Gait Ambulation/Gait assistance: Min guard Gait Distance (Feet): 400 Feet Assistive device: Rolling walker (2 wheeled);IV Pole Gait Pattern/deviations: Step-through pattern;Decreased step length - right;Decreased step length - left;Shuffle Gait velocity: mod pace   General Gait Details: 200' with RW and additional 200' with hand on IV pole.  Pt with mild general instability but no LOB   Stairs             Wheelchair Mobility    Modified Rankin (Stroke Patients Only)       Balance Overall balance assessment: Needs assistance;History of Falls Sitting-balance support: No upper extremity supported;Feet supported Sitting balance-Leahy Scale: Good     Standing balance support: No upper extremity supported Standing balance-Leahy Scale: Fair                              Cognition Arousal/Alertness: Awake/alert Behavior During Therapy: WFL for tasks assessed/performed Overall Cognitive Status: Within Functional Limits for tasks assessed                                        Exercises      General Comments        Pertinent Vitals/Pain Pain Assessment: No/denies pain    Home Living                      Prior Function            PT Goals (current goals can now be found in the care plan section) Acute Rehab PT Goals Patient Stated Goal: to get better. PT Goal Formulation: With patient Time For Goal Achievement: 07/12/19 Potential to Achieve Goals: Good Progress towards PT goals: Progressing toward goals    Frequency    Min 3X/week      PT Plan Current plan remains appropriate    Co-evaluation              AM-PAC PT "6 Clicks" Mobility   Outcome Measure  Help needed turning from your back to your side while in a flat bed without using bedrails?: None Help needed moving from lying on your back to sitting on the side  of a flat bed without using bedrails?: None Help needed moving to and from a bed to a chair (including a wheelchair)?: A Little Help needed standing up from a chair using your arms (e.g., wheelchair or bedside chair)?: A Little Help needed to walk in hospital room?: A Little Help needed climbing 3-5 steps with a railing? : A Little 6 Click Score: 20    End of Session Equipment Utilized During Treatment: Gait belt Activity Tolerance: Patient tolerated treatment well Patient left: in bed;with call bell/phone within reach;with chair alarm set Nurse Communication: Mobility status PT Visit Diagnosis: Unsteadiness on feet (R26.81);History of falling (Z91.81);Repeated falls (R29.6);Muscle weakness (generalized) (M62.81)     Time: 6063-0160 PT Time  Calculation (min) (ACUTE ONLY): 20 min  Charges:  $Gait Training: 8-22 mins                     Elfrida Pager [phone] Office [phone]    Kimberlly Norgard 06/30/2019, 10:33 AM

## 2019-06-30 NOTE — Discharge Instructions (Signed)
Colitis  Colitis is inflammation of the colon. Colitis may last a short time (be acute), or it may last a long time (become chronic). What are the causes? This condition may be caused by:  Viruses.  Bacteria.  Reaction to medicine.  Certain autoimmune diseases such as Crohn's disease or ulcerative colitis.  Radiation treatment.  Decreased blood flow to the bowel (ischemia). What are the signs or symptoms? Symptoms of this condition include:  Watery diarrhea.  Passing bloody or tarry stool.  Pain.  Fever.  Vomiting.  Tiredness (fatigue).  Weight loss.  Bloating.  Abdominal pain.  Having fewer bowel movements than usual.  A strong and sudden urge to have a bowel movement.  Feeling like the bowel is not empty after a bowel movement. How is this diagnosed? This condition is diagnosed with a stool test or a blood test. You may also have other tests, such as:  X-rays.  CT scan.  Colonoscopy.  Endoscopy.  Biopsy. How is this treated? Treatment for this condition depends on the cause. The condition may be treated by:  Resting the bowel. This involves not eating or drinking for a period of time.  Fluids that are given through an IV.  Medicine for pain and diarrhea.  Antibiotic medicines.  Cortisone medicines.  Surgery. Follow these instructions at home: Eating and drinking   Follow instructions from your health care provider about eating or drinking restrictions.  Drink enough fluid to keep your urine pale yellow.  Work with a dietitian to determine which foods cause your condition to flare up.  Avoid foods that cause flare-ups.  Eat a well-balanced diet. General instructions  If you were prescribed an antibiotic medicine, take it as told by your health care provider. Do not stop taking the antibiotic even if you start to feel better.  Take over-the-counter and prescription medicines only as told by your health care provider.  Keep all  follow-up visits as told by your health care provider. This is important. Contact a health care provider if:  Your symptoms do not go away.  You develop new symptoms. Get help right away if you:  Have a fever that does not go away with treatment.  Develop chills.  Have extreme weakness, fainting, or dehydration.  Have repeated vomiting.  Develop severe pain in your abdomen.  Pass bloody or tarry stool. Summary  Colitis is inflammation of the colon. Colitis may last a short time (be acute), or it may last a long time (become chronic).  Treatment for this condition depends on the cause and may include resting the bowel, taking medicines, or having surgery.  If you were prescribed an antibiotic medicine, take it as told by your health care provider. Do not stop taking the antibiotic even if you start to feel better.  Get help right away if you develop severe pain in your abdomen.  Keep all follow-up visits as told by your health care provider. This is important. This information is not intended to replace advice given to you by your health care provider. Make sure you discuss any questions you have with your health care provider. Document Released: 07/29/2004 Document Revised: 12/22/2017 Document Reviewed: 12/22/2017 Elsevier Patient Education  2020 [employer]  

## 2019-06-30 NOTE — Progress Notes (Signed)
PROGRESS NOTE  Nancy Franco  DOB: 1940-11-15  PCP: Patient, No Pcp Per [facility]:[id number]  DOA: 06/25/2019  LOS: 2 days   Chief Complaint  Patient presents with  . Constipation   Brief narrative: Nancy A Orourkeis a 78 y.o.femalewith medical history significant of a.fib. HLD, Vertigo, falls Patient presented to the ED on 12/21 with 2-week history ofconstipation, nausea, no appetite and severe abdominal pain. 12/20, patient was was seen in ED for 2 weeks history of constipation and abdominal pain.  She was rehydrated and discharged. 12/21, patient returned back to ED. She also reported urinary frequency. In the ED, CT Abdomen and Pelvis showed findings consistent with colitis involving the descending and sigmoid colon, with relative sparing of the distal sigmoid and rectum.  Covid PCR negative.  Subjective: Patient was seen and examined this morning.  Pleasant elderly Caucasian female.  Lying down in bed.  Not in distress.  Wants to try some solid food.  Bowel movement normal.  Assessment/Plan: Acute Colitis -Presented with constipation, abdominal pain, nausea -CT scan finding as above showing acute colitis involving descending and sigmoid colon. -Admitted and treated with IV ciprofloxacin, IV Flagyl, IV fluids. -Repeat x-ray 12/24 showed nonobstructive bowel gas pattern. -Diet Advancedto soft diet yesterday.  Able to tolerate without recurrence of symptoms. -Feels better this morning.   Acute lower UTI -Urinalysis showed cloudy appearance with small hemoglobin, 15 ketones, negative leukocytes, positive nitrites, many bacteria, 0-5 RBCs per high-power field, and 11-20 WBCs -She has urinary frequency typical of her prior presentation of UTI and she is currently on Cipro -Urine culture negative.  A. fib with RVR -Patient reports history of A. fib controlled on metoprolol.  Not on anticoagulation.  Patient reports that she sees cardiologist at [facility] and  underwent a full work-up including echocardiogram 6 to 8 weeks ago.  We do not have access to records at [facility]. -12/25, patient went into A. fib with RVR after physical therapy evaluation.  She felt palpitation.  I started her on Cardizem bolus and drip.  Patient also received her routine dose of scheduled metoprolol succinate 50 mg last night.  Sometimes in the night, patient converted back to normal sinus rhythm.  I stopped her Cardizem drip this morning.  Patient has remained in normal sinus rhythm this morning.  She worked with physical therapy and did not flip back to A. fib again. -CHADSVASC score is 14 (age, gender, HTN, HLD). -One cannot be to put her on anticoagulation.  However, patient's A. fib was short-lived and was related to her acute illness and is already converted back to normal sinus rhythm.  -I have recommended patient to follow-up with her cardiologist as an outpatient.  She wants to discuss the risk and benefit of anticoagulation with her cardiologist. -In the meantime I have recommended her to take an extra half dose of metoprolol succinate (25 mg) if she goes into RVR at home prior to next cardiology appointment. -Patient seems under considerable stress about the risk of recurrence of A. fib.  She wants to be monitored in the hospital for 1 more night.  Daughter asks for the same.  Hypokalemia -Potassium improved with replacement  Hypothyroidism  -Checked TSH and was 1.053 -Continue home medications at current dose at Levothyroxine 50 mcg po Daily   Hyperlipidemia -Chronic continue home medications  Essential Hypertension -Continue metoprolol. -Blood pressure elevated to 150s.  Resume lisinopril/HCTZ today.  Hyperglycemia -Obtain A1c.  Mobility: Pending PT eval Diet: Soft diet today Fluid:  Stop IV fluid DVT prophylaxis:  SCDs Code Status:  Full code Family Communication:  None at bedside Expected Discharge:  Continue to monitor on telemetry for next 24  hours. Hopefully home tomorrow  Consultants:  None  Procedures:  None  Antimicrobials: Anti-infectives (From admission, onward)   Start     Dose/Rate Route Frequency Ordered Stop   06/26/19 1800  ciprofloxacin (CIPRO) IVPB 400 mg     400 mg 200 mL/hr over 60 Minutes Intravenous Every 12 hours 06/26/19 1548     06/26/19 1600  metroNIDAZOLE (FLAGYL) IVPB 500 mg     500 mg 100 mL/hr over 60 Minutes Intravenous Every 8 hours 06/26/19 1548     06/26/19 0145  ciprofloxacin (CIPRO) IVPB 400 mg     400 mg 200 mL/hr over 60 Minutes Intravenous  Once 06/26/19 0131 06/26/19 0246   06/26/19 0145  metroNIDAZOLE (FLAGYL) IVPB 500 mg     500 mg 100 mL/hr over 60 Minutes Intravenous  Once 06/26/19 0131 06/26/19 0358        Code Status: Full Code   Diet Order            DIET SOFT Room service appropriate? Yes; Fluid consistency: Thin  Diet effective now              Infusions:  . ciprofloxacin 400 mg (06/30/19 0540)  . diltiazem (CARDIZEM) infusion Stopped (06/30/19 0829)  . metronidazole 500 mg (06/30/19 0845)    Scheduled Meds: . aspirin  81 mg Oral Daily  . atorvastatin  40 mg Oral Daily  . feeding supplement  1 Container Oral TID BM  . feeding supplement (PRO-STAT SUGAR FREE 64)  30 mL Oral BID  . levothyroxine  50 mcg Oral QAC breakfast  . metoprolol succinate  50 mg Oral Daily  . multivitamin with minerals  1 tablet Oral Daily  . polyethylene glycol  17 g Oral BID  . senna-docusate  1 tablet Oral BID    PRN meds: acetaminophen **OR** acetaminophen, fentaNYL (SUBLIMAZE) injection, HYDROcodone-acetaminophen, meclizine, ondansetron **OR** ondansetron (ZOFRAN) IV, promethazine   Objective: Vitals:   06/29/19 2103 06/30/19 0516  BP: 138/66 (!) 143/64  Pulse: 79 70  Resp: 17 18  Temp: 98.1 F (36.7 C) (!) 97.3 F (36.3 C)  SpO2: 95% 96%    Intake/Output Summary (Last 24 hours) at 06/30/2019 1143 Last data filed at 06/30/2019 0853 Gross per 24 hour  Intake  3015.18 ml  Output 1400 ml  Net 1615.18 ml   Filed Weights   06/25/19 2156  Weight: 70 kg   Weight change:  Body mass index is 27.34 kg/m.   Physical Exam: General exam: Appears calm and comfortable.  Skin: No rashes, lesions or ulcers. HEENT: Atraumatic, normocephalic, supple neck, no obvious bleeding Lungs: Clear to auscultation bilaterally CVS: Regular rate and rhythm, no murmur GI/Abd soft, nontender, nondistended, bowel sound present CNS: Alert, awake, oriented x3 Psychiatry: Anxious today because of A. fib with RVR yesterday Extremities: No pedal edema, no calf tenderness  Data Review: I have personally reviewed the laboratory data and studies available.  Recent Labs  Lab 06/25/19 2337 06/26/19 2043 06/27/19 0325 06/28/19 1041 06/29/19 0510 06/30/19 0629  WBC 15.9* 9.4 8.4 7.0 8.2 6.7  NEUTROABS 13.8* 6.7  --  5.4 6.2 4.8  HGB 16.5* 13.7 12.8 13.5 14.7 13.5  HCT 50.3* 42.8 40.8 41.6 44.3 42.1  MCV 96.5 98.2 101.2* 98.1 95.7 97.5  PLT 281 246 229 213 232 232   Recent  Labs  Lab 06/26/19 2043 06/27/19 0325 06/28/19 1041 06/29/19 0510 06/30/19 0629  NA 137 138 139 137 137  K 3.7 4.1 3.3* 3.3* 3.7  CL 105 107 106 102 106  CO2 23 22 21* 23 22  GLUCOSE 112* 82 123* 114* 156*  BUN 18 17 8  <5* 7*  CREATININE 0.71 0.69 0.56 0.57 0.63  CALCIUM 8.6* 8.3* 8.3* 8.5* 8.1*  MG  --  2.0 1.7 2.0  --   PHOS  --  3.1 2.7 2.7  --     Lorin GlassBinaya Maddox Hlavaty, MD  [facility] 06/30/2019

## 2019-06-30 NOTE — Plan of Care (Signed)
Patient rested well overnight, no more occurrences of episodic facial flushing, or nausea. Patient reporting improvement of nausea with zofran PO.

## 2019-07-01 DIAGNOSIS — R109 Unspecified abdominal pain: Secondary | ICD-10-CM

## 2019-07-01 MED ORDER — CIPROFLOXACIN HCL 500 MG PO TABS: 500.0000 mg | ORAL_TABLET | Freq: Two times a day (BID) | ORAL | 0 refills | Status: AC

## 2019-07-01 MED ORDER — METOPROLOL SUCCINATE ER 50 MG PO TB24: 50.0000 mg | ORAL_TABLET | Freq: Every day | ORAL | 1 refills | Status: AC

## 2019-07-01 MED ORDER — LEVOTHYROXINE SODIUM 50 MCG PO TABS: 50.0000 ug | ORAL_TABLET | Freq: Every day | ORAL | 1 refills | Status: AC

## 2019-07-01 MED ORDER — METRONIDAZOLE 500 MG PO TABS: 500.0000 mg | ORAL_TABLET | Freq: Three times a day (TID) | ORAL | 0 refills | Status: AC

## 2019-07-01 MED ORDER — MECLIZINE HCL 25 MG PO TABS: 25.0000 mg | ORAL_TABLET | Freq: Three times a day (TID) | ORAL | 1 refills | Status: AC | PRN

## 2019-07-01 MED ORDER — ATORVASTATIN CALCIUM 40 MG PO TABS: 40.0000 mg | ORAL_TABLET | Freq: Every day | ORAL | 1 refills | Status: AC

## 2019-07-01 MED ORDER — LISINOPRIL-HYDROCHLOROTHIAZIDE 10-12.5 MG PO TABS: 1.0000 | ORAL_TABLET | Freq: Every day | ORAL | 1 refills | Status: AC

## 2019-07-01 MED ORDER — ONDANSETRON 4 MG PO TBDP: 4.0000 mg | ORAL_TABLET | Freq: Three times a day (TID) | ORAL | 0 refills | Status: AC | PRN

## 2019-07-01 NOTE — Plan of Care (Signed)

## 2019-07-01 NOTE — Discharge Summary (Signed)
Physician Discharge Summary  Nancy NoeMary A Metter ZOX:[id number] DOB: [birth year] DOA: 06/25/2019  PCP: Patient, No Pcp Per  Admit date: 06/25/2019 Discharge date: 07/01/2019  Admitted From: home Disposition:  home  Recommendations for Outpatient Follow-up:  1. Follow up with PCP in 1-2 weeks 2. Please obtain BMP/CBC in one week 3. Please follow up on the following pending results:  Home Health: none Equipment/Devices: none  Discharge Condition: stable CODE STATUS: Full code Diet recommendation: regular  HPI: Per admitting MD, Nancy Franco is a 78 y.o. female with medical history significant of a.fib. HLD, Vertigo, falls Presented with  2wk hx of Constipation, Nausea no appetite severe abdominal pain. She was seen yesterday in emergency department complaining of constipation for at least 2 weeks with signconstipationificant abdominal pain.  Patient was rehydrated and was able to be discharged to home. She returned to ER today reporting Nausea  abd pain. She reports only symptoms of UTi is frequency of urination states she has frequent UTi but never have any dysuria. She has seen Urologist in the past. She is usually treated with antibiotics.  Reports recently she have had increased frequency of urination and had to get up at night to urinate.  She has been having nausea   and dry heaving for 2 weeks.  This has started after patient had recent fall on 06/09/2019 she injured her right shoulder was evaluated in the emergency department and was prescribed Vicodin. She denies any history of fevers no chest pain or shortness of breath Patient is status post apendectomy and cholecystectomy. Prior to starting Vicodin she was treating her pain with ibuprofen and Tylenol.  Patient is not on blood thinners secondary to recurrent falls  Hospital Course / Discharge diagnoses: Acute colitis-CT scan findings on admission with acute colitis of the descending and sigmoid colon, patient was started on  ciprofloxacin and metronidazole, IV fluids with improvement in her abdominal complaints, her diet was advanced to soft and she is able to tolerate without recurrence of symptoms.  Patient is stable and will be discharged home and is to finish treatment with antibiotics as an outpatient Acute lower UTI-based on initial urinalysis and symptoms however cultures remain negative.  Cipro as above A. fib with RVR-patient does report a history of A. fib controlled on metoprolol, currently not on anticoagulation.  She [facility].  She did have an episode of A. fib with RVR while hospitalized while working with physical therapy requiring Cardizem IV but then converted back to sinus rhythm.  She has remained in sinus for the rest of her hospital stay.  Dr. Pola Cornahal discussed with patient regarding anticoagulation and she would prefer to discuss this further with her own cardiologist Hypothyroidism-continue Synthroid Hyperlipidemia-continue home medications Hypertension-continue home medications  Discharge Instructions   Allergies as of 07/01/2019   No Known Allergies     Medication List    STOP taking these medications   HYDROcodone-acetaminophen 5-325 MG tablet Commonly known as: NORCO/VICODIN     TAKE these medications   aspirin 81 MG chewable tablet Chew 81 mg by mouth daily.   atorvastatin 40 MG tablet Commonly known as: LIPITOR Take 1 tablet (40 mg total) by mouth daily.   ciprofloxacin 500 MG tablet Commonly known as: CIPRO Take 1 tablet (500 mg total) by mouth 2 (two) times daily for 5 days.   levothyroxine 50 MCG tablet Commonly known as: SYNTHROID Take 1 tablet (50 mcg total) by mouth daily before breakfast.  lisinopril-hydrochlorothiazide 10-12.5 MG tablet Commonly known as: ZESTORETIC Take 1 tablet by mouth daily.   meclizine 25 MG tablet Commonly known as: ANTIVERT Take 1 tablet (25 mg total) by mouth 3 (three) times daily as needed for  dizziness or nausea.   metoprolol succinate 50 MG 24 hr tablet Commonly known as: TOPROL-XL Take 1 tablet (50 mg total) by mouth daily.   metroNIDAZOLE 500 MG tablet Commonly known as: Flagyl Take 1 tablet (500 mg total) by mouth 3 (three) times daily for 5 days.   ondansetron 4 MG disintegrating tablet Commonly known as: Zofran ODT Take 1 tablet (4 mg total) by mouth every 8 (eight) hours as needed for nausea or vomiting.      Follow-up Information    PCP Follow up.   Contact information: Patient states she has a primary care provider with [facility].       Cardiologist. Schedule an appointment as soon as possible for a visit in 1 week(s).   Contact information: Patient states he has a cardiologist with [facility] Follow up.   Specialty: Gastroenterology Contact information: [address] [place] [address] [phone]          Consultations:  None   Procedures/Studies:  DG Shoulder Right  Result Date: 06/13/2019 CLINICAL DATA:  Right shoulder pain since a fall 4-5 days ago. EXAM: RIGHT SHOULDER - 2+ VIEW COMPARISON:  None. FINDINGS: There is no fracture, dislocation, or abnormal self tissue calcification. Minimal osteophyte formation on the inferior rim of the glenoid. IMPRESSION: No acute abnormality. Minimal degenerative changes of the glenohumeral joint. Electronically Signed   By: Francene Boyers M.D.   On: 06/13/2019 16:39   DG Abd 1 View  Result Date: 06/28/2019 CLINICAL DATA:  Generalized lower abdominal pain and nausea today. EXAM: ABDOMEN - 1 VIEW COMPARISON:  CT 06/26/2019 FINDINGS: Bowel gas pattern is nonobstructive. No free peritoneal air. Mild degenerate change of the spine and hips. IMPRESSION: Nonobstructive bowel gas pattern. Electronically Signed   By: Elberta Fortis M.D.   On: 06/28/2019 10:27   CT Abdomen Pelvis W Contrast  Result Date: 06/26/2019 CLINICAL  DATA:  Nausea, vomiting, bowel obstruction suspected skull EXAM: CT ABDOMEN AND PELVIS WITH CONTRAST TECHNIQUE: Multidetector CT imaging of the abdomen and pelvis was performed using the standard protocol following bolus administration of intravenous contrast. CONTRAST:  OMNIPAQUE IOHEXOL 300 MG/ML  SOLN COMPARISON:  None. FINDINGS: Lower chest: Asymmetric elevation of the right hemidiaphragm, nonspecific. Some adjacent atelectatic changes noted. Additional bandlike opacities in the left lung base may reflect atelectasis or scarring. Cardiac size is within normal limits. Extensive coronary artery calcification is noted. 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 Trace pericardial fluid within normal limits. Hepatobiliary: No focal liver abnormality is seen. Patient is post cholecystectomy. Slight prominence of the biliary tree likely related to reservoir effect. No calcified intraductal gallstones. Pancreas: Unremarkable. No pancreatic ductal dilatation or surrounding inflammatory changes. Spleen: Normal in size without focal abnormality. Adrenals/Urinary Tract: Normal adrenal glands. 2.4 cm fluid attenuation cysts seen in the lower pole right kidney. No worrisome renal lesions. Kidneys enhance and excrete symmetrically. No urolithiasis or hydronephrosis. Bladder is unremarkable. Stomach/Bowel: Distal esophagus and stomach are unremarkable. Small 4 mm mural nodule noted in the second portion of the duodenum (3/36). Distal duodenum take takes a normal course. No small bowel dilatation or wall thickening. The appendix is surgically  absent. Interposition of the right colon anterior to the liver. There is extensive segmental thickening of the descending and sigmoid colon with pericolonic hazy stranding. There is relative sparing of the distal sigmoid and rectum. There are scattered colonic diverticula in this region but without identifiable culprit diverticulum.  No extraluminal gas is seen. No organized abscess. No air within the draining veins. No convincing evidence of bowel obstruction. Vascular/Lymphatic: Atherosclerotic plaque within the normal caliber aorta. Additional calcifications in the branch vessels. Reactive adenopathy in the low abdomen. No pathologically enlarged nodes. Reproductive: Anteverted uterus with parametrial calcification. No concerning adnexal lesions. Other: Trace free fluid in the left hemipelvis, likely reactive. No free air. No bowel containing hernia. Musculoskeletal: Multilevel degenerative changes are present in the imaged portions of the spine. 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 Additional degenerative changes noted in the hips. No acute osseous abnormality or suspicious osseous lesion. IMPRESSION: 1. Findings are most consistent with colitis involving the descending and sigmoid colon, with relative sparing of the distal sigmoid and rectum. Differential considerations include infectious, inflammatory, or ischemic etiologies. 2. Small 4 mm mural nodule in the second portion of the duodenum, may represent a small duodenal polyp. Consider evaluation with outpatient EGD. 3. Asymmetric elevation of the right hemidiaphragm, nonspecific. 4. Additional incidental and ancillary findings as detailed above. 5. Aortic Atherosclerosis (ICD10-I70.0). Electronically Signed   By: Kreg Shropshire M.D.   On: 06/26/2019 01:03   DG Humerus Right  Result Date: 06/13/2019 CLINICAL DATA:  Right shoulder and arm pain secondary to a fall 4-5 days ago. EXAM: RIGHT HUMERUS - 2+ VIEW COMPARISON:  None. FINDINGS: There is no evidence of fracture or other focal bone lesions. Soft tissues are unremarkable. IMPRESSION: Negative. Electronically Signed   By: Francene Boyers M.D.   On: 06/13/2019 16:39      Subjective: - no chest pain, shortness of breath, no abdominal pain, nausea or vomiting.  Complains of dizziness which is  chronic  Discharge Exam: BP 138/78 (BP Location: Left Arm)    Pulse 79    Temp 98.4 F (36.9 C) (Oral)    Resp 16    Ht  (1.6 m)    Wt 70 kg    SpO2 95%    BMI 27.34 kg/m   General: Pt is alert, awake, not in acute distress Cardiovascular: RRR, S1/S2 +, no rubs, no gallops Respiratory: CTA bilaterally, no wheezing, no rhonchi Abdominal: Soft, NT, ND, bowel sounds + Extremities: no edema, no cyanosis    The results of significant diagnostics from this hospitalization (including imaging, microbiology, ancillary and laboratory) are listed below for reference.     Microbiology: Recent Results (from the past 240 hour(s))  Urine culture     Status: None   Collection Time: 06/26/19  3:34 AM   Specimen: Urine, Catheterized  Result Value Ref Range Status   Specimen Description   Final    URINE, CATHETERIZED Performed at [facility], [address] [place], [place] [address]    Special Requests   Final    NONE Performed at [facility], [address] [place], [place] [address]    Culture   Final    NO GROWTH Performed at [facility], [address] [place], [place] [address]    Report Status 06/27/2019 FINAL  Final  SARS CORONAVIRUS 2 (TAT 6-24 HRS) Nasopharyngeal Nasopharyngeal Swab     Status: None   Collection Time: 06/26/19  5:09 AM  Specimen: Nasopharyngeal Swab  Result Value Ref Range Status   SARS Coronavirus 2 NEGATIVE NEGATIVE Final    Comment: (NOTE) SARS-CoV-2 target nucleic acids are NOT DETECTED. The SARS-CoV-2 RNA is generally detectable in upper and lower respiratory specimens during the acute phase of infection. Negative results do not preclude SARS-CoV-2 infection, do not rule out co-infections with other pathogens, and should not be used as the sole basis for treatment or other patient management decisions. Negative results must be combined with clinical observations, patient history, and epidemiological information.  The expected result is Negative. Fact Sheet for Patients: SugarRoll.be Fact Sheet for Healthcare Providers: https://www.woods-mathews.com/ This test is not yet approved or cleared by the [place] FDA and  has been authorized for detection and/or diagnosis of SARS-CoV-2 by FDA under an Emergency Use Authorization (EUA). This EUA will remain  in effect (meaning this test can be used) for the duration of the COVID-19 declaration under Section 56 4(b)(1) of the Act, 21 U.S.C. section 360bbb-3(b)(1), unless the authorization is terminated or revoked sooner. Performed at [facility], [place] [address] [place], [place] [address]      Labs: Basic Metabolic Panel: Recent Labs  Lab 06/26/19 2043 06/27/19 0325 06/28/19 1041 06/29/19 0510 06/30/19 0629  NA 137 138 139 137 137  K 3.7 4.1 3.3* 3.3* 3.7  CL 105 107 106 102 106  CO2 23 22 21* 23 22  GLUCOSE 112* 82 123* 114* 156*  BUN 18 17 8  <5* 7*  CREATININE 0.71 0.69 0.56 0.57 0.63  CALCIUM 8.6* 8.3* 8.3* 8.5* 8.1*  MG  --  2.0 1.7 2.0  --   PHOS  --  3.1 2.7 2.7  --    Liver Function Tests: Recent Labs  Lab 06/26/19 0005 06/26/19 2043 06/27/19 0325 06/28/19 1041 06/29/19 0510  AST 28 24 33 27 27  ALT 24 22 25 23 20   ALKPHOS 89 74 69 71 71  BILITOT 1.9* 2.0* 2.0* 1.7* 1.5*  PROT 7.0 6.3* 5.7* 6.3* 6.8  ALBUMIN 3.7 3.1* 2.9* 3.2* 3.2*   CBC: Recent Labs  Lab 06/25/19 2337 06/26/19 2043 06/27/19 0325 06/28/19 1041 06/29/19 0510 06/30/19 0629  WBC 15.9* 9.4 8.4 7.0 8.2 6.7  NEUTROABS 13.8* 6.7  --  5.4 6.2 4.8  HGB 16.5* 13.7 12.8 13.5 14.7 13.5  HCT 50.3* 42.8 40.8 41.6 44.3 42.1  MCV 96.5 98.2 101.2* 98.1 95.7 97.5  PLT 281 246 229 213 232 232   CBG: Recent Labs  Lab 06/27/19 0558  GLUCAP 124*   Hgb A1c Recent Labs    06/29/19 1415  HGBA1C 5.6   Lipid Profile No results for input(s): CHOL, HDL, LDLCALC, TRIG, CHOLHDL, LDLDIRECT in the last 72  hours. Thyroid function studies No results for input(s): TSH, T4TOTAL, T3FREE, THYROIDAB in the last 72 hours.  Invalid input(s): FREET3 Urinalysis    Component Value Date/Time   COLORURINE YELLOW 06/26/2019 0334   APPEARANCEUR CLOUDY (A) 06/26/2019 0334   LABSPEC 1.010 06/26/2019 0334   PHURINE 5.0 06/26/2019 0334   GLUCOSEU NEGATIVE 06/26/2019 0334   HGBUR SMALL (A) 06/26/2019 0334   BILIRUBINUR NEGATIVE 06/26/2019 0334   KETONESUR 15 (A) 06/26/2019 0334   PROTEINUR NEGATIVE 06/26/2019 0334   NITRITE POSITIVE (A) 06/26/2019 0334   LEUKOCYTESUR NEGATIVE 06/26/2019 0334    FURTHER DISCHARGE INSTRUCTIONS:   Get Medicines reviewed and adjusted: Please take all your medications with you for your next visit with your Primary MD   Laboratory/radiological data: Please request  your Primary MD to go over all hospital tests and procedure/radiological results at the follow up, please ask your Primary MD to get all Hospital records sent to his/her office.   In some cases, they will be blood work, cultures and biopsy results pending at the time of your discharge. Please request that your primary care M.D. goes through all the records of your hospital data and follows up on these results.   Also Note the following: If you experience worsening of your admission symptoms, develop shortness of breath, life threatening emergency, suicidal or homicidal thoughts you must seek medical attention immediately by calling 911 or calling your MD immediately  if symptoms less severe.   You must read complete instructions/literature along with all the possible adverse reactions/side effects for all the Medicines you take and that have been prescribed to you. Take any new Medicines after you have completely understood and accpet all the possible adverse reactions/side effects.    Do not drive when taking Pain medications or sleeping medications (Benzodaizepines)   Do not take more than prescribed Pain,  Sleep and Anxiety Medications. It is not advisable to combine anxiety,sleep and pain medications without talking with your primary care practitioner   Special Instructions: If you have smoked or chewed Tobacco  in the last 2 yrs please stop smoking, stop any regular Alcohol  and or any Recreational drug use.   Wear Seat belts while driving.   Please note: You were cared for by a hospitalist during your hospital stay. Once you are discharged, your primary care physician will handle any further medical issues. Please note that NO REFILLS for any discharge medications will be authorized once you are discharged, as it is imperative that you return to your primary care physician (or establish a relationship with a primary care physician if you do not have one) for your post hospital discharge needs so that they can reassess your need for medications and monitor your lab values.  Time coordinating discharge: 35 minutes  SIGNED:  Pamella Pert, MD, PhD 07/01/2019, 3:23 PM

## 2019-08-19 ENCOUNTER — Ambulatory Visit: Payer: Medicare HMO

## 2021-04-04 IMAGING — DX DG HUMERUS 2V *R*
2 series · 2 of 2 positions shown · non-contrast
Comparison: None.

CLINICAL DATA: Right shoulder and arm pain secondary to a fall 4-5
days ago.

EXAM:
RIGHT HUMERUS - 2+ VIEW

[humerus ap]
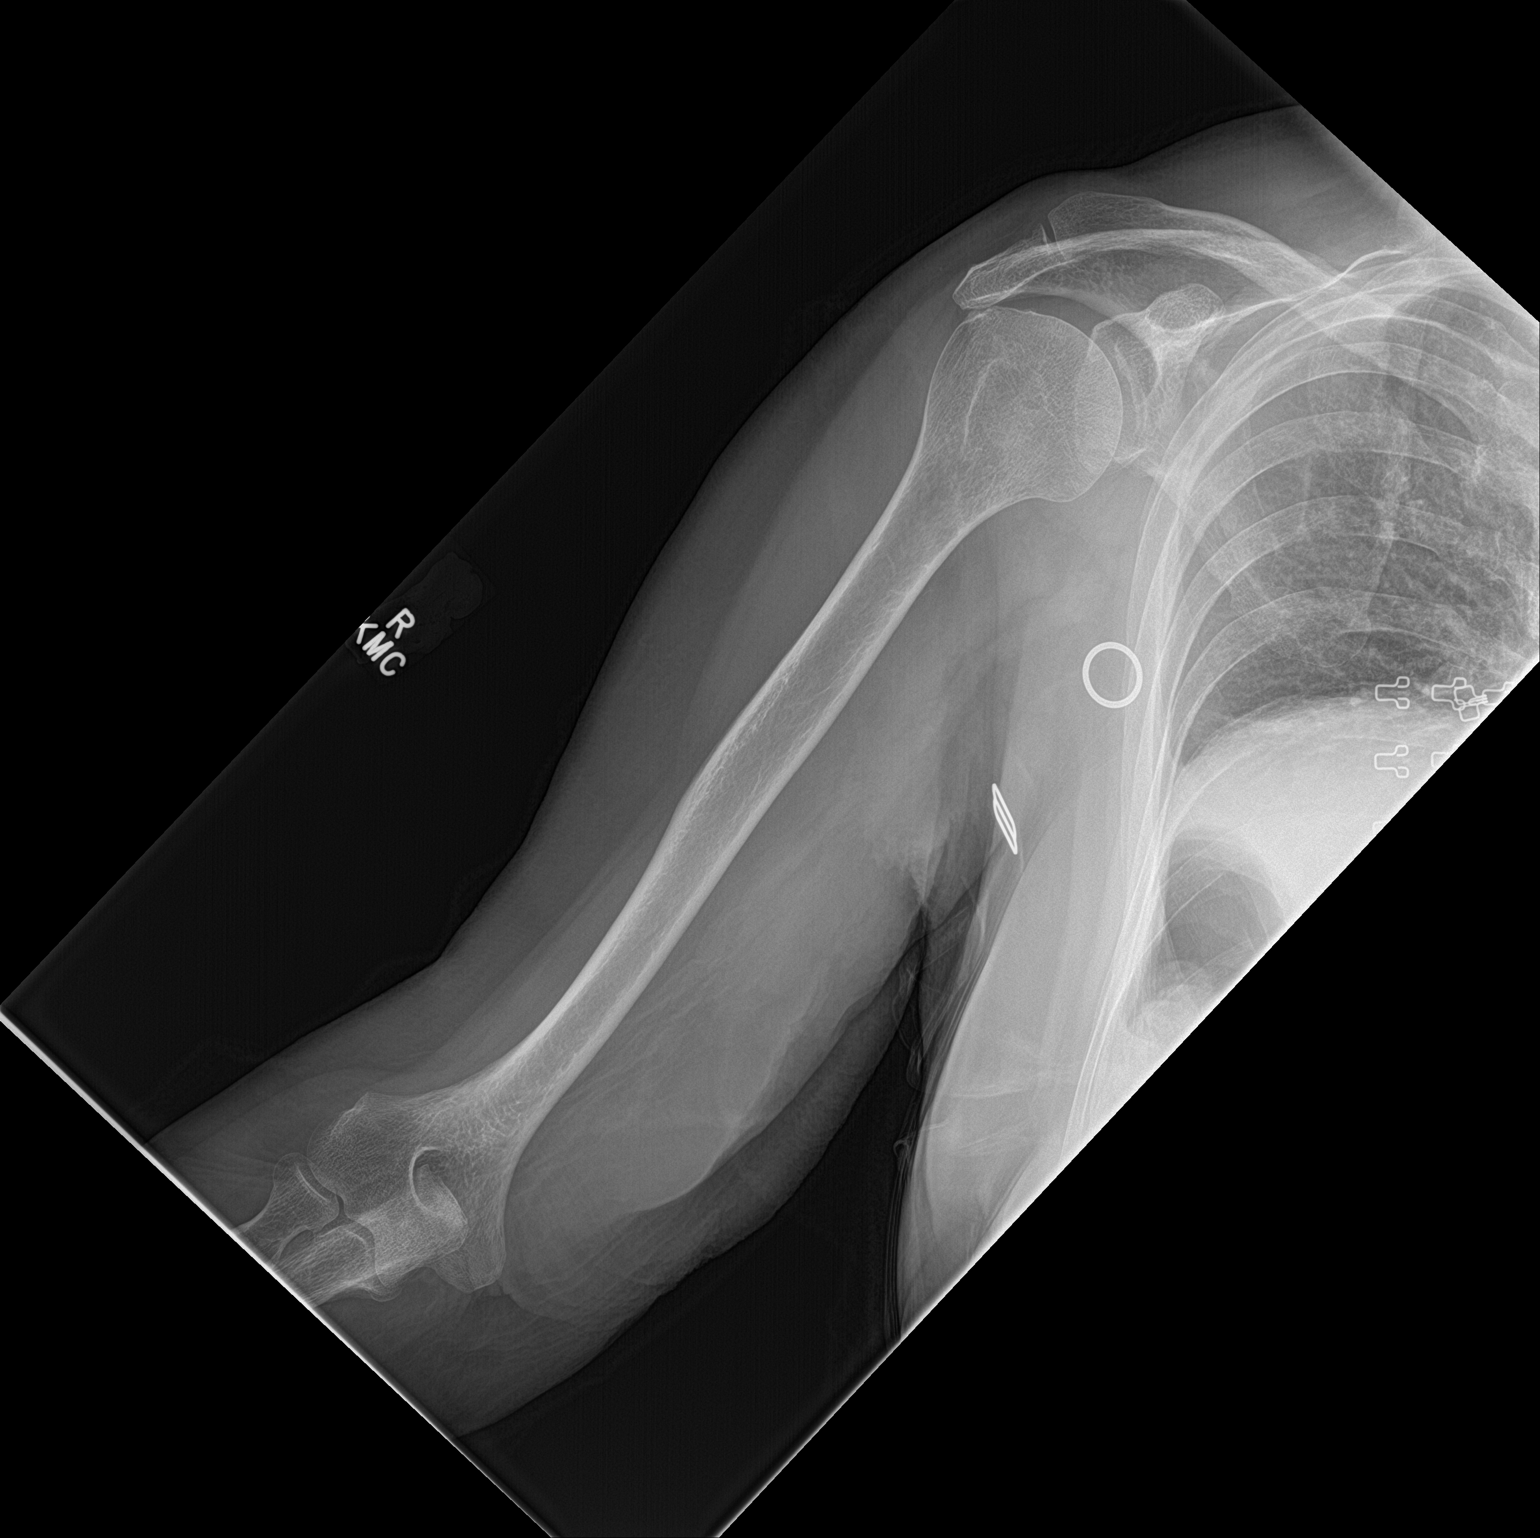

[humerus lat]
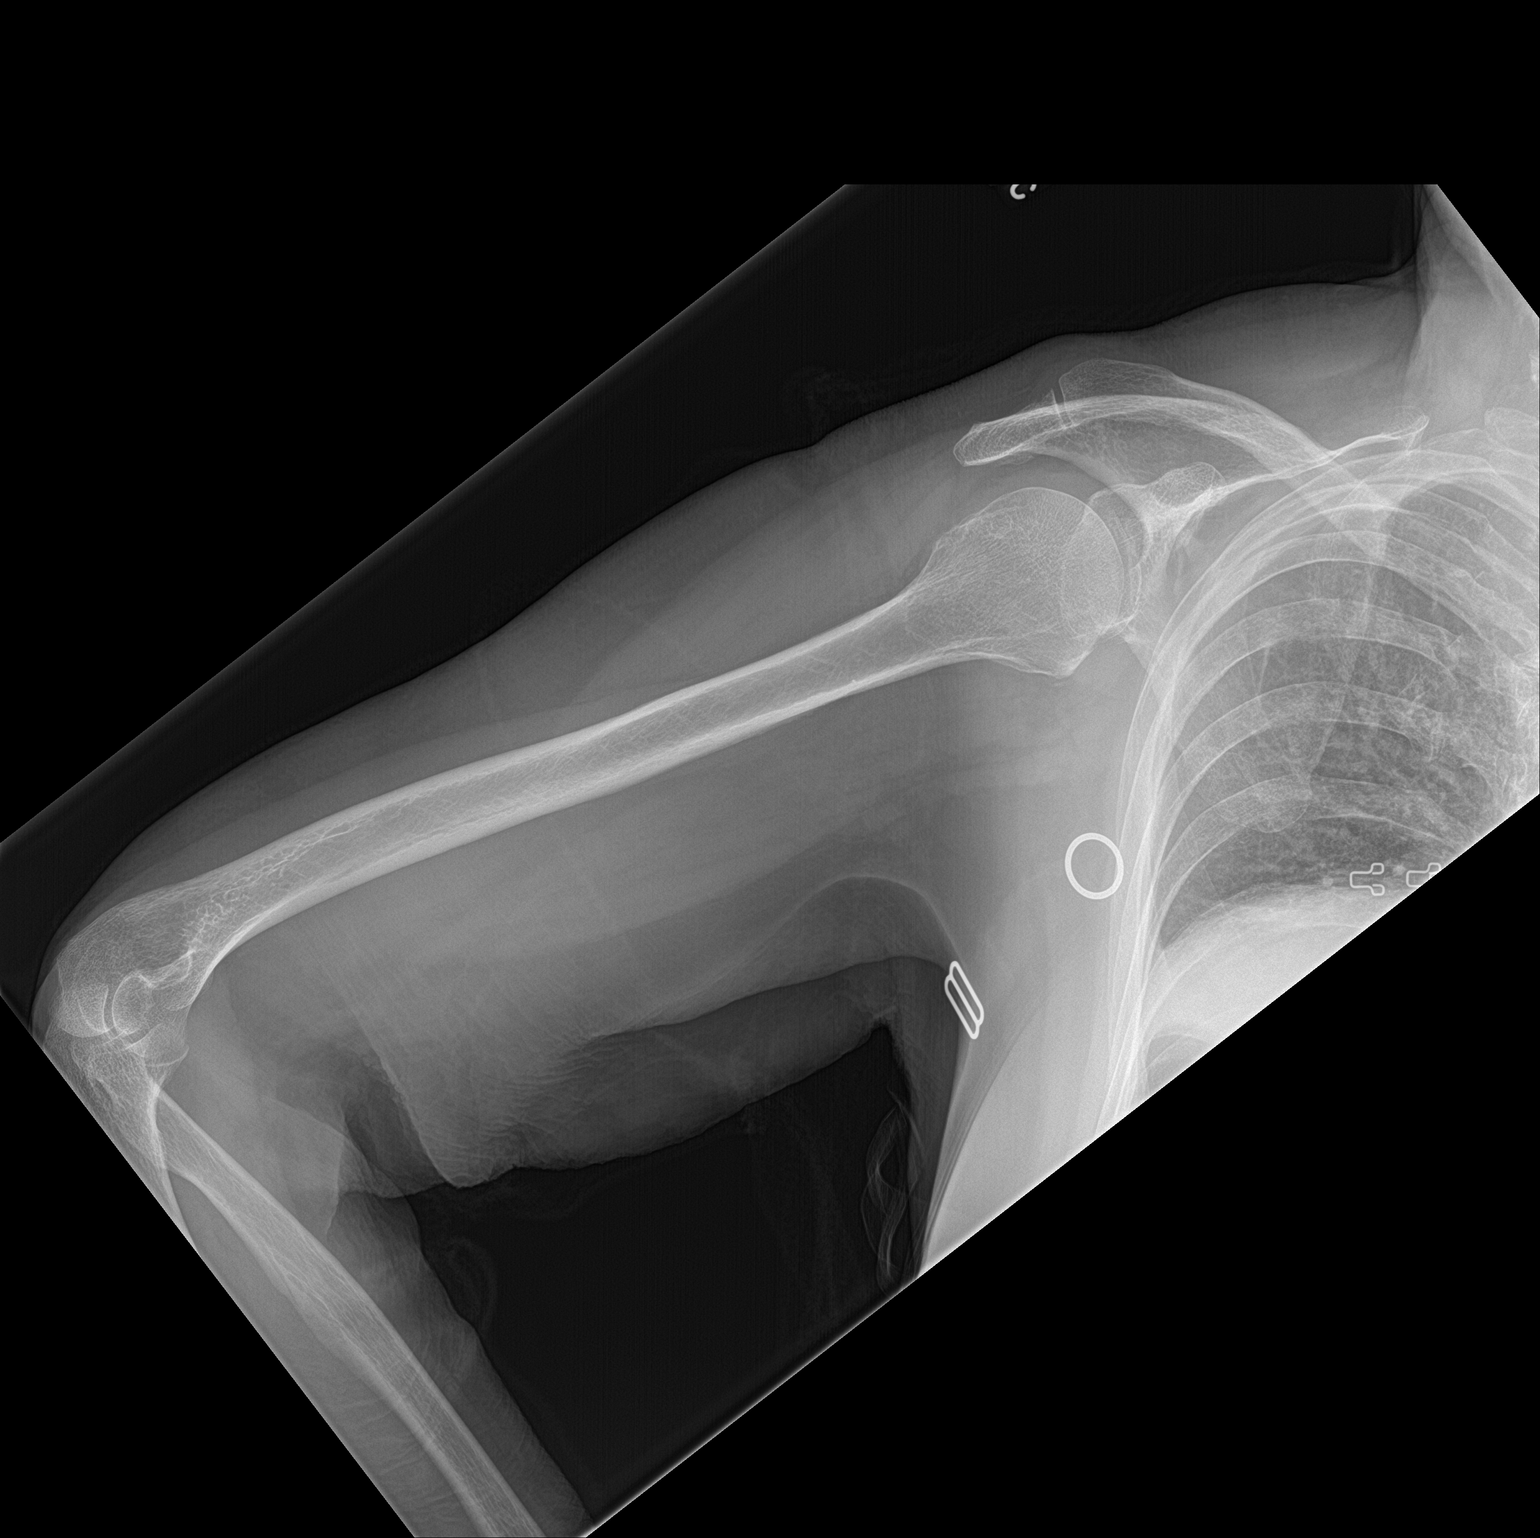

[2 of 2 positions shown; findings below may reference images not displayed]

FINDINGS: There is no evidence of fracture or other focal bone lesions. Soft
tissues are unremarkable.
IMPRESSION: Negative.

## 2021-04-17 IMAGING — CT CT ABD-PELV W/ CM
2 of 5 series · 14 of 46 positions shown, 16 images · IV contrast (Omnipaque)
Comparison: None.

CLINICAL DATA: Nausea, vomiting, bowel obstruction suspected skull

EXAM:
CT ABDOMEN AND PELVIS WITH CONTRAST
TECHNIQUE: Multidetector CT imaging of the abdomen and pelvis was performed
using the standard protocol following bolus administration of
intravenous contrast.
CONTRAST:  100mL OMNIPAQUE IOHEXOL 300 MG/ML  SOLN

[Series 3: axial st · axial · 0.84mm/px · z∈[-578,-138]mm · 11 of 100 slices shown, 13 images]
[im 6/100  soft-tissue]
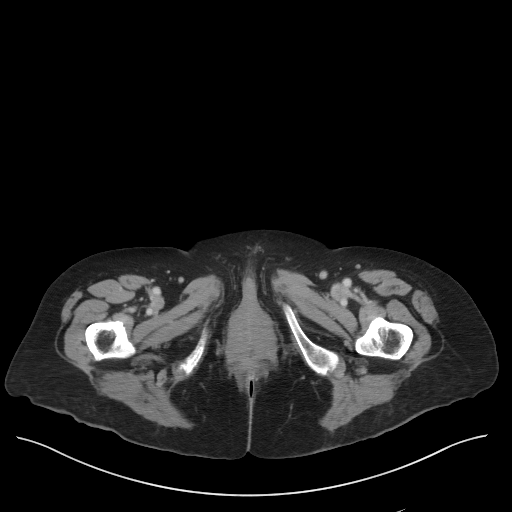
[im 6/100  bone]
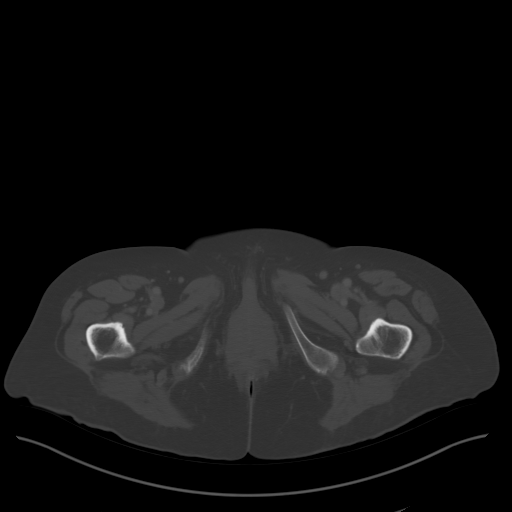
[im 17/100  soft-tissue]
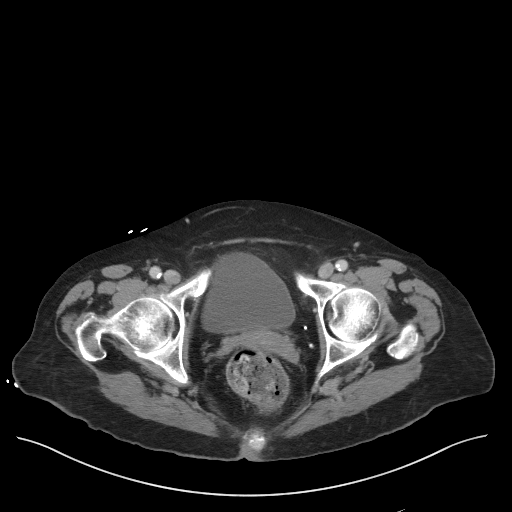
[im 23/100  soft-tissue]
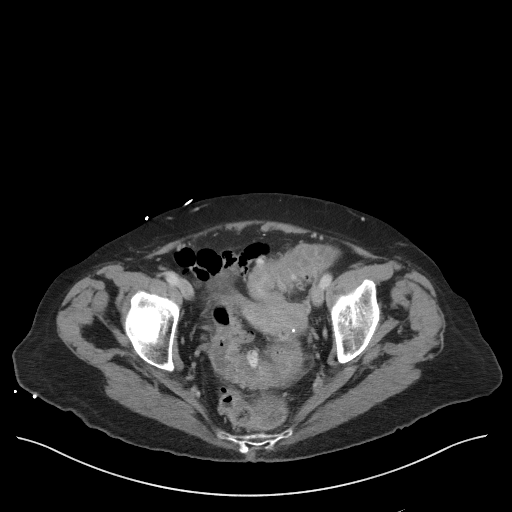
[im 34/100  soft-tissue]
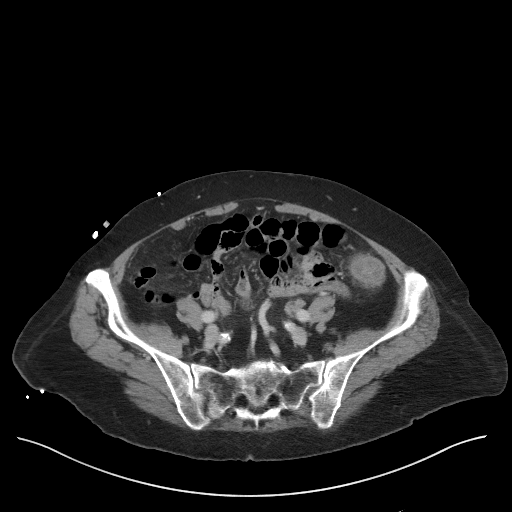
[im 39/100  soft-tissue]
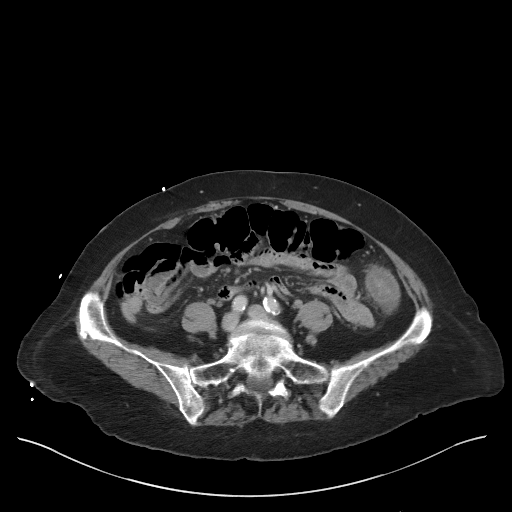
[im 50/100  soft-tissue]
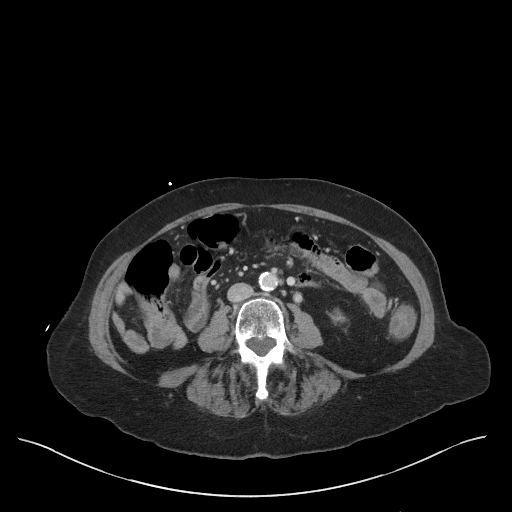
[im 61/100  soft-tissue]
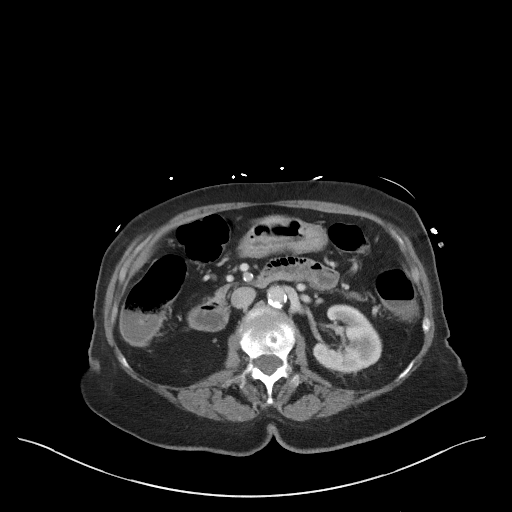
[im 67/100  soft-tissue]
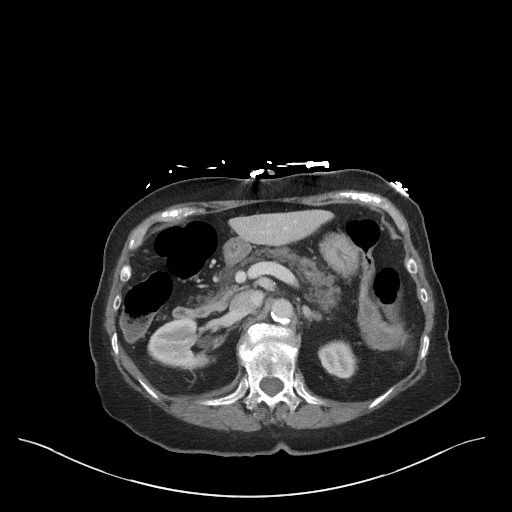
[im 78/100  soft-tissue]
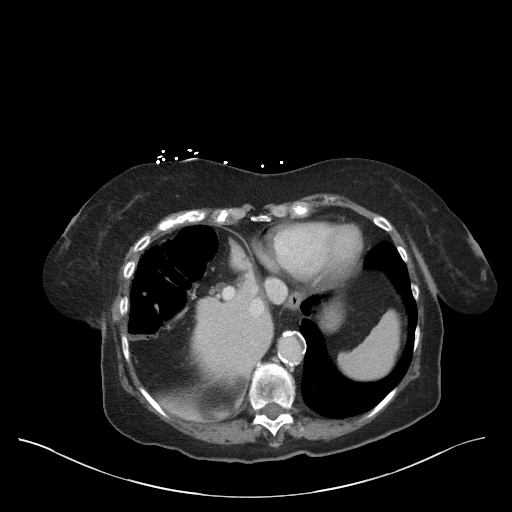
[im 78/100  bone]
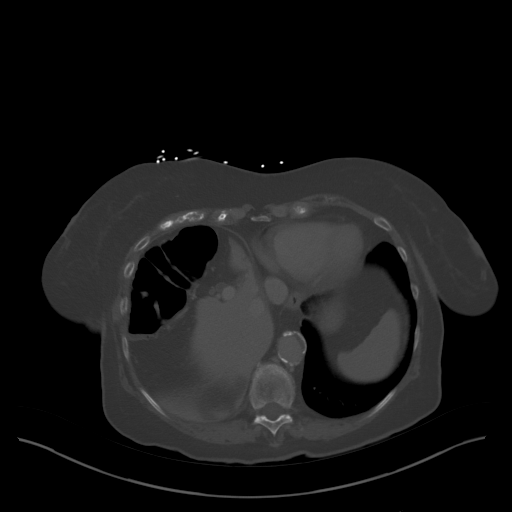
[im 83/100  soft-tissue]
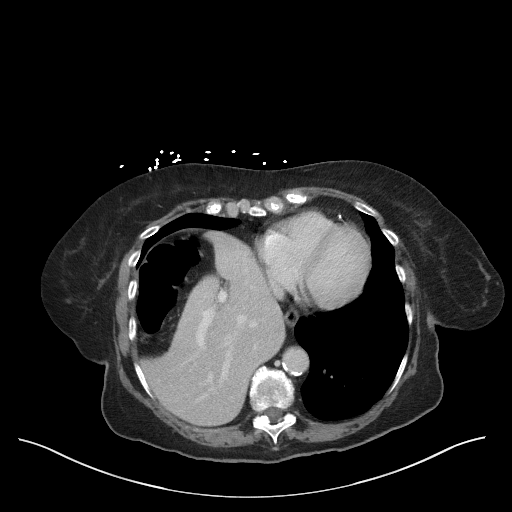
[im 94/100  soft-tissue]
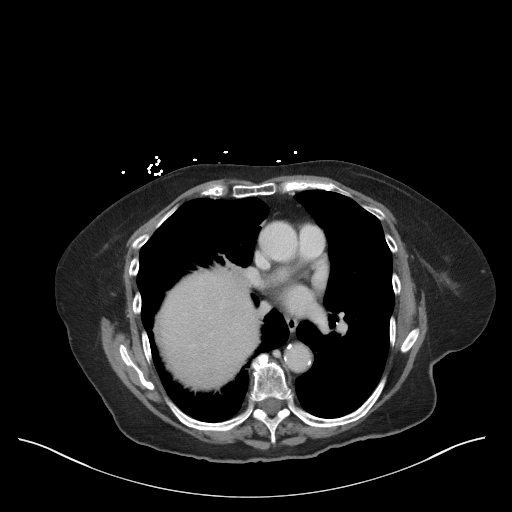

[Series 6: coronal st · coronal · 0.72mm/px · 3 of 93 slices shown]
[im 31/93  soft-tissue]
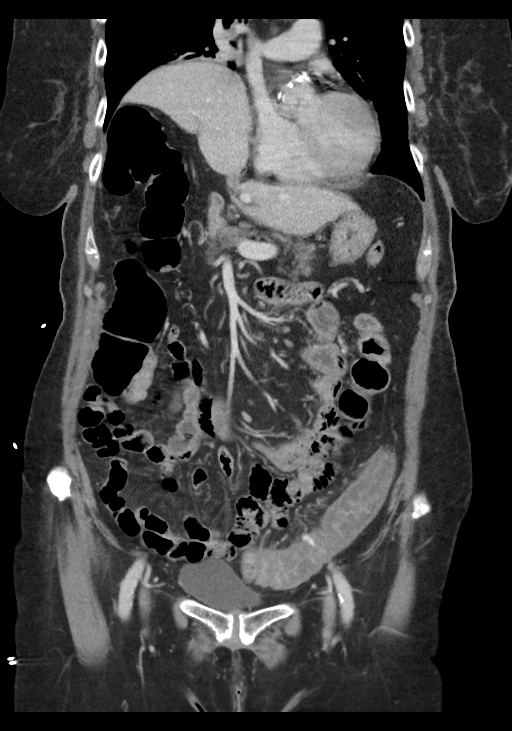
[im 41/93  soft-tissue]
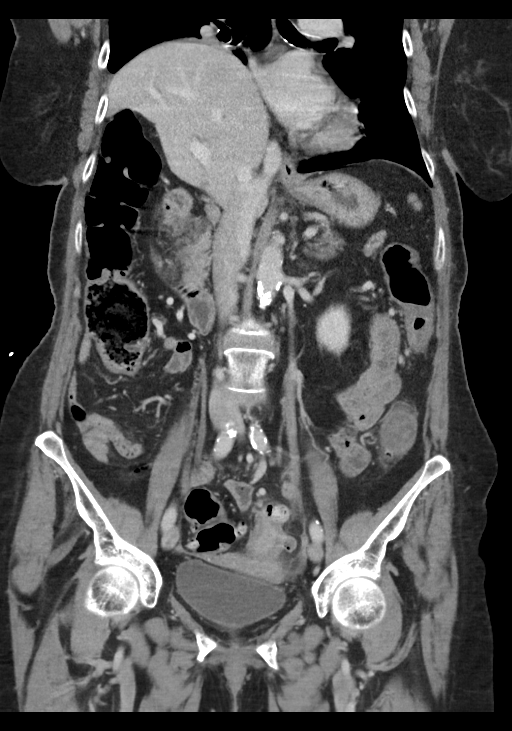
[im 52/93  soft-tissue]
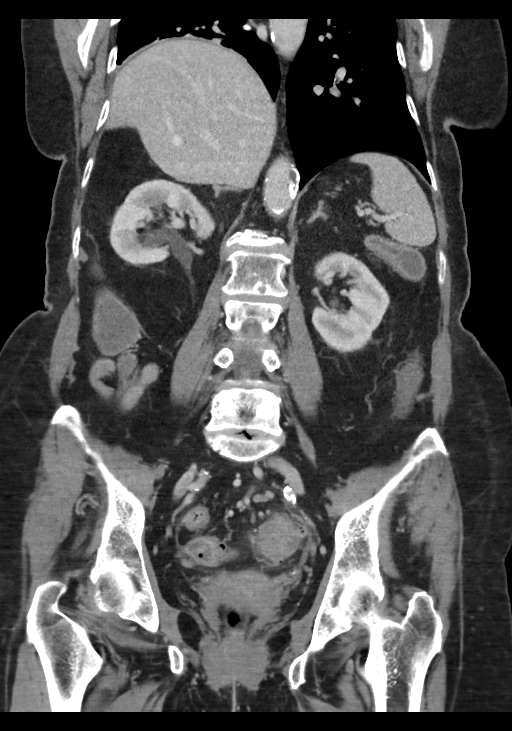

[14 of 46 positions shown; findings below may reference images not displayed]

FINDINGS: Lower chest: Asymmetric elevation of the right hemidiaphragm,
nonspecific. Some adjacent atelectatic changes noted. Additional
bandlike opacities in the left lung base may reflect atelectasis or
scarring. Cardiac size is within normal limits. Extensive coronary
artery calcification is noted. Rounded metallic radiodensity at the
level of the aortic valve likely aortic valve prosthesis. Trace
pericardial fluid within normal limits.

Hepatobiliary: No focal liver abnormality is seen. Patient is post
cholecystectomy. Slight prominence of the biliary tree likely
related to reservoir effect. No calcified intraductal gallstones.

Pancreas: Unremarkable. No pancreatic ductal dilatation or
surrounding inflammatory changes.

Spleen: Normal in size without focal abnormality.

Adrenals/Urinary Tract: Normal adrenal glands. 2.4 cm fluid
attenuation cysts seen in the lower pole right kidney. No worrisome
renal lesions. Kidneys enhance and excrete symmetrically. No
urolithiasis or hydronephrosis. Bladder is unremarkable.

Stomach/Bowel: Distal esophagus and stomach are unremarkable. Small
4 mm mural nodule noted in the second portion of the duodenum
(3/36). Distal duodenum take takes a normal course. No small bowel
dilatation or wall thickening. The appendix is surgically absent.
Interposition of the right colon anterior to the liver. There is
extensive segmental thickening of the descending and sigmoid colon
with pericolonic hazy stranding. There is relative sparing of the
distal sigmoid and rectum. There are scattered colonic diverticula
in this region but without identifiable culprit diverticulum. No
extraluminal gas is seen. No organized abscess. No air within the
draining veins. No convincing evidence of bowel obstruction.

Vascular/Lymphatic: Atherosclerotic plaque within the normal caliber
aorta. Additional calcifications in the branch vessels. Reactive
adenopathy in the low abdomen. No pathologically enlarged nodes.

Reproductive: Anteverted uterus with parametrial calcification. No
concerning adnexal lesions.

Other: Trace free fluid in the left hemipelvis, likely reactive. No
free air. No bowel containing hernia.

Musculoskeletal: Multilevel degenerative changes are present in the
imaged portions of the spine. Interspinous arthrosis L3-L5
compatible with Baastrup's disease. Additional degenerative changes
noted in the hips. No acute osseous abnormality or suspicious
osseous lesion.
IMPRESSION: 1. Findings are most consistent with colitis involving the
descending and sigmoid colon, with relative sparing of the distal
sigmoid and rectum. Differential considerations include infectious,
inflammatory, or ischemic etiologies.
2. Small 4 mm mural nodule in the second portion of the duodenum,
may represent a small duodenal polyp. Consider evaluation with
outpatient EGD.
3. Asymmetric elevation of the right hemidiaphragm, nonspecific.
4. Additional incidental and ancillary findings as detailed above.
5. Aortic Atherosclerosis ([id number]).

## 2022-11-21 ENCOUNTER — Other Ambulatory Visit: Payer: Self-pay

## 2022-11-21 ENCOUNTER — Emergency Department (HOSPITAL_BASED_OUTPATIENT_CLINIC_OR_DEPARTMENT_OTHER)
Admission: EM | Admit: 2022-11-21 | Discharge: 2022-11-21 | Disposition: A | Discharge status: Home / Self Care | Payer: Medicare HMO | Attending: Emergency Medicine | Admitting: Emergency Medicine

## 2022-11-21 ENCOUNTER — Encounter (HOSPITAL_BASED_OUTPATIENT_CLINIC_OR_DEPARTMENT_OTHER): Payer: Self-pay

## 2022-11-21 DIAGNOSIS — Z79899 Other long term (current) drug therapy: Secondary | ICD-10-CM | POA: Diagnosis not present

## 2022-11-21 DIAGNOSIS — Z7982 Long term (current) use of aspirin: Secondary | ICD-10-CM | POA: Insufficient documentation

## 2022-11-21 DIAGNOSIS — Z950 Presence of cardiac pacemaker: Secondary | ICD-10-CM | POA: Insufficient documentation

## 2022-11-21 DIAGNOSIS — Z7901 Long term (current) use of anticoagulants: Secondary | ICD-10-CM | POA: Diagnosis not present

## 2022-11-21 DIAGNOSIS — R040 Epistaxis: Secondary | ICD-10-CM | POA: Diagnosis present

## 2022-11-21 DIAGNOSIS — I4891 Unspecified atrial fibrillation: Secondary | ICD-10-CM | POA: Insufficient documentation

## 2022-11-21 MED ORDER — OXYMETAZOLINE HCL 0.05 % NA SOLN
1.0000 | Freq: Once | NASAL | Status: AC
  Administered 2022-11-21: 1 via NASAL
  Filled 2022-11-21: qty 30

## 2022-11-21 NOTE — Discharge Instructions (Signed)
Please read and follow all provided instructions.  Your diagnoses today include:  1. Left-sided epistaxis    Tests performed today include: Vital signs. See below for your results today.   Medications prescribed:  None  Home care instructions:  Read the educational materials provided and follow any instructions contained in this packet.  Please hold Eliquis and restart on Tuesday morning.   If your nosebleed happens again: Pinch your nose and hold for 15 minutes without letting go.  If this does not stop the bleeding, pinch and hold for another 15 minutes.  If it continues to bleed after this, please come to the Emergency Department or see your family doctor.   Follow-up instructions: Please follow-up with your primary care provider when able for further evaluation of your symptoms.   Return instructions:  Please return to the Emergency Department if you experience worsening symptoms.  Please return if you have any other emergent concerns.  Additional Information:  Your vital signs today were: BP (!) 130/58 (BP Location: Left Arm)   Pulse 79   Temp 97.9 F (36.6 C) (Oral)   Resp 18   Ht 5\' 3"  (1.6 m)   Wt 70 kg   SpO2 98%   BMI 27.34 kg/m  If your blood pressure (BP) was elevated above 135/85 this visit, please have this repeated by your doctor within one month. --------------

## 2022-11-21 NOTE — ED Notes (Signed)
Held direct pressure for 7 minutes. Patient is still having bleeding from her nose.

## 2022-11-21 NOTE — ED Triage Notes (Signed)
Patient had a nose bleed that started around 9am. Bleeding from the left nare. Clamp applied.

## 2022-11-21 NOTE — ED Provider Notes (Signed)
[place] [facility] Provider Note   CSN: [id number] Arrival date & time: 11/21/22  4098     History  Chief Complaint  Patient presents with   Epistaxis    Nancy Franco is a 82 y.o. female.  Patient with history of atrial fibrillation, on Eliquis, status post pacemaker for about a year and a half --presents for evaluation of nosebleed that started just prior to arrival.  Patient had brisk bleeding from her left nare.  She states that she has had a previous episode 7 or 8 years ago.  She denies other symptoms.  She last took her Eliquis last night.  Again, she takes this for atrial fibrillation.  No chest pain, nausea, vomiting.  No URI symptoms.       Home Medications Prior to Admission medications   Medication Sig Start Date End Date Taking? Authorizing Provider  aspirin 81 MG chewable tablet Chew 81 mg by mouth daily.    [provider]  atorvastatin (LIPITOR) 40 MG tablet Take 1 tablet (40 mg total) by mouth daily. 07/01/19   Leatha Gilding, MD  levothyroxine (SYNTHROID) 50 MCG tablet Take 1 tablet (50 mcg total) by mouth daily before breakfast. 07/01/19   Leatha Gilding, MD  lisinopril-hydrochlorothiazide (ZESTORETIC) 10-12.5 MG tablet Take 1 tablet by mouth daily. 07/01/19   Leatha Gilding, MD  meclizine (ANTIVERT) 25 MG tablet Take 1 tablet (25 mg total) by mouth 3 (three) times daily as needed for dizziness or nausea. 07/01/19   Leatha Gilding, MD  metoprolol succinate (TOPROL-XL) 50 MG 24 hr tablet Take 1 tablet (50 mg total) by mouth daily. 07/01/19   Leatha Gilding, MD  ondansetron (ZOFRAN ODT) 4 MG disintegrating tablet Take 1 tablet (4 mg total) by mouth every 8 (eight) hours as needed for nausea or vomiting. 07/01/19   Leatha Gilding, MD      Allergies    Patient has no known allergies.    Review of Systems   Review of Systems  Physical Exam Updated Vital Signs BP (!) 130/58 (BP Location: Left  Arm)   Pulse 79   Temp 97.9 F (36.6 C) (Oral)   Resp 18   Ht 5\' 3"  (1.6 m)   Wt 70 kg   SpO2 98%   BMI 27.34 kg/m  Physical Exam Vitals and nursing note reviewed.  Constitutional:      Appearance: She is well-developed.  HENT:     Head: Normocephalic and atraumatic.     Nose:     Right Nostril: No epistaxis.     Left Nostril: Epistaxis present.  Eyes:     Conjunctiva/sclera: Conjunctivae normal.  Pulmonary:     Effort: No respiratory distress.  Musculoskeletal:     Cervical back: Normal range of motion and neck supple.  Skin:    General: Skin is warm and dry.  Neurological:     Mental Status: She is alert.     ED Results / Procedures / Treatments   Labs (all labs ordered are listed, but only abnormal results are displayed) Labs Reviewed - No data to display  EKG None  Radiology No results found.  Procedures .Epistaxis Management  Date/Time: 11/21/2022 10:59 AM  Performed by: Renne Crigler, PA-C Authorized by: Renne Crigler, PA-C   Consent:    Consent obtained:  Verbal   Consent given by:  Patient   Risks discussed:  Pain and bleeding   Alternatives discussed:  Alternative treatment  Universal protocol:    Patient identity confirmed:  Verbally with patient and provided demographic data Procedure details:    Treatment site:  L anterior   Treatment method:  Nasal balloon   Treatment complexity:  Limited   Treatment episode: initial   Post-procedure details:    Assessment:  Bleeding decreased   Procedure completion:  Tolerated well, no immediate complications Comments:     Balloon inflated to total 10cc 5.5cc rapid rhino     Medications Ordered in ED Medications  oxymetazoline (AFRIN) 0.05 % nasal spray 1 spray (1 spray Each Nare Given 11/21/22 0919)    ED Course/ Medical Decision Making/ A&P    Patient seen and examined. History obtained directly from patient.  Afrin pledgets placed and nose clamp placed on arrival.  Labs/EKG: None  ordered  Imaging: None ordered  Medications/Fluids: None ordered  Most recent vital signs reviewed and are as follows: BP (!) 130/58 (BP Location: Left Arm)   Pulse 79   Temp 97.9 F (36.6 C) (Oral)   Resp 18   Ht 5\' 3"  (1.6 m)   Wt 70 kg   SpO2 98%   BMI 27.34 kg/m   Initial impression: Epistaxis  10:11 AM Reassessment performed. Patient appears stable. After 1 hour, I pulled Afrin pledget. A large clot came out with this. Clamp reapplied. Mild oozing. Will continue to monitor. Daughter now at bedside.   Plan: Reassess.  10:59 AM Reassessment performed.  Bleeding resumed.  Patient spitting up blood.  Will place rapid Rhino.  This was done.  Patient tolerated.  Currently bleeding seems to be improved.  Most current vital signs reviewed and are as follows: BP (!) 176/80   Pulse 75   Temp 97.9 F (36.6 C) (Oral)   Resp 18   Ht 5\' 3"  (1.6 m)   Wt 70 kg   SpO2 97%   BMI 27.34 kg/m   Plan: Reassess.  11:40 AM Reassessment performed. Patient appears comfortable.  I inflated the balloon of the rapid Rhino to 10 cc total.   Most current vital signs reviewed and are as follows: BP (!) 176/80   Pulse 75   Temp 97.9 F (36.6 C) (Oral)   Resp 18   Ht 5\' 3"  (1.6 m)   Wt 70 kg   SpO2 97%   BMI 27.34 kg/m   Plan: Call ENT tomorrow to schedule follow-up appointment.  Prescriptions written for: None  Other home care instructions discussed: Hold Eliquis for 48 hours  ED return instructions discussed: Return with recurrent bleeding  Follow-up instructions discussed: Patient encouraged to follow-up with ENT this week for packing removal.                            Medical Decision Making Risk OTC drugs.   Patient with left nare epistaxis complicated by anticoagulation.  Initially improved with pressure, however once Afrin packing removed, bleeding resumed.  5.5 cc rapid Rhino was placed without complication.  Patient tolerated well.  Bleeding stopped.  Plan for  discharge at this time.  ENT follow-up this week.  Referral given.        Final Clinical Impression(s) / ED Diagnoses Final diagnoses:  Left-sided epistaxis    Rx / DC Orders ED Discharge Orders     None         Renne Crigler, PA-C 11/21/22 1142    Tegeler, Canary Brim, MD 11/21/22 1420

## 2023-05-16 ENCOUNTER — Emergency Department (HOSPITAL_BASED_OUTPATIENT_CLINIC_OR_DEPARTMENT_OTHER)
Admission: EM | Admit: 2023-05-16 | Discharge: 2023-05-16 | Disposition: A | Discharge status: Home / Self Care | Payer: Medicare HMO | Attending: Emergency Medicine | Admitting: Emergency Medicine

## 2023-05-16 ENCOUNTER — Emergency Department (HOSPITAL_BASED_OUTPATIENT_CLINIC_OR_DEPARTMENT_OTHER): Payer: Medicare HMO

## 2023-05-16 ENCOUNTER — Other Ambulatory Visit: Payer: Self-pay

## 2023-05-16 ENCOUNTER — Other Ambulatory Visit (HOSPITAL_BASED_OUTPATIENT_CLINIC_OR_DEPARTMENT_OTHER): Payer: Self-pay

## 2023-05-16 ENCOUNTER — Encounter (HOSPITAL_BASED_OUTPATIENT_CLINIC_OR_DEPARTMENT_OTHER): Payer: Self-pay | Admitting: Emergency Medicine

## 2023-05-16 DIAGNOSIS — K5900 Constipation, unspecified: Secondary | ICD-10-CM

## 2023-05-16 DIAGNOSIS — K5792 Diverticulitis of intestine, part unspecified, without perforation or abscess without bleeding: Secondary | ICD-10-CM | POA: Diagnosis not present

## 2023-05-16 DIAGNOSIS — R1084 Generalized abdominal pain: Secondary | ICD-10-CM | POA: Diagnosis present

## 2023-05-16 LAB — CBC WITH DIFFERENTIAL/PLATELET
Abs Immature Granulocytes: 0.03 10*3/uL (ref 0.00–0.07)
Basophils Absolute: 0.1 10*3/uL (ref 0.0–0.1)
Basophils Relative: 1 %
Eosinophils Absolute: 0 10*3/uL (ref 0.0–0.5)
Eosinophils Relative: 0 %
HCT: 45.5 % (ref 36.0–46.0)
Hemoglobin: 15.3 g/dL — ABNORMAL HIGH (ref 12.0–15.0)
Immature Granulocytes: 0 %
Lymphocytes Relative: 12 %
Lymphs Abs: 1.1 10*3/uL (ref 0.7–4.0)
MCH: 31.4 pg (ref 26.0–34.0)
MCHC: 33.6 g/dL (ref 30.0–36.0)
MCV: 93.4 fL (ref 80.0–100.0)
Monocytes Absolute: 0.8 10*3/uL (ref 0.1–1.0)
Monocytes Relative: 8 %
Neutro Abs: 7.3 10*3/uL (ref 1.7–7.7)
Neutrophils Relative %: 79 %
Platelets: 203 10*3/uL (ref 150–400)
RBC: 4.87 MIL/uL (ref 3.87–5.11)
RDW: 13.8 % (ref 11.5–15.5)
WBC: 9.3 10*3/uL (ref 4.0–10.5)
nRBC: 0 % (ref 0.0–0.2)

## 2023-05-16 LAB — COMPREHENSIVE METABOLIC PANEL
ALT: 14 U/L (ref 0–44)
AST: 24 U/L (ref 15–41)
Albumin: 3.6 g/dL (ref 3.5–5.0)
Alkaline Phosphatase: 69 U/L (ref 38–126)
Anion gap: 12 (ref 5–15)
BUN: 17 mg/dL (ref 8–23)
CO2: 26 mmol/L (ref 22–32)
Calcium: 8.8 mg/dL — ABNORMAL LOW (ref 8.9–10.3)
Chloride: 100 mmol/L (ref 98–111)
Creatinine, Ser: 0.81 mg/dL (ref 0.44–1.00)
GFR, Estimated: >60 mL/min (ref >60)
Glucose, Bld: 102 mg/dL — ABNORMAL HIGH (ref 70–99)
Potassium: 4.2 mmol/L (ref 3.5–5.1)
Sodium: 138 mmol/L (ref 135–145)
Total Bilirubin: 2.5 mg/dL — ABNORMAL HIGH (ref <1.2)
Total Protein: 7 g/dL (ref 6.5–8.1)

## 2023-05-16 LAB — LIPASE, BLOOD: Lipase: 22 U/L (ref 11–51)

## 2023-05-16 MED ORDER — ONDANSETRON HCL 4 MG PO TABS
4.0000 mg | ORAL_TABLET | Freq: Four times a day (QID) | ORAL | 0 refills | Status: DC
  Filled 2023-05-16: qty 12, 3d supply, fill #0

## 2023-05-16 MED ORDER — ACETAMINOPHEN 325 MG PO TABS
650.0000 mg | ORAL_TABLET | Freq: Once | ORAL | Status: AC
  Administered 2023-05-16: 650 mg via ORAL
  Filled 2023-05-16: qty 2

## 2023-05-16 MED ORDER — KETOROLAC TROMETHAMINE 15 MG/ML IJ SOLN
15.0000 mg | Freq: Once | INTRAMUSCULAR | Status: AC
  Administered 2023-05-16: 15 mg via INTRAVENOUS
  Filled 2023-05-16: qty 1

## 2023-05-16 MED ORDER — ONDANSETRON HCL 4 MG/2ML IJ SOLN
4.0000 mg | Freq: Once | INTRAMUSCULAR | Status: AC
  Administered 2023-05-16: 4 mg via INTRAVENOUS
  Filled 2023-05-16: qty 2

## 2023-05-16 MED ORDER — IOHEXOL 300 MG/ML  SOLN
100.0000 mL | Freq: Once | INTRAMUSCULAR | Status: AC | PRN
  Administered 2023-05-16: 100 mL via INTRAVENOUS

## 2023-05-16 MED ORDER — AMOXICILLIN-POT CLAVULANATE 875-125 MG PO TABS
1.0000 | ORAL_TABLET | Freq: Two times a day (BID) | ORAL | 0 refills | Status: AC
  Filled 2023-05-16: qty 14, 7d supply, fill #0

## 2023-05-16 NOTE — Discharge Instructions (Signed)
Recommend 3-4 capfuls of MiraLAX in 20 ounces of water or other noncarbonated beverage.  Titrate to effect.  Take antibiotic as prescribed for possible diverticulitis.  Follow-up with your primary care doctor.  Recommend 1000 mg of Tylenol every 6 hours as needed for pain.

## 2023-05-16 NOTE — ED Triage Notes (Signed)
Gl abd pain , constipation x 1 week . Laxative use with no relief .  Nausea and vomiting yesterday .

## 2023-05-16 NOTE — ED Provider Notes (Signed)
[place] [facility] Provider Note   CSN: [id number] Arrival date & time: 05/16/23  4098     History  Chief Complaint  Patient presents with   Abdominal Pain    Nancy Franco is a 82 y.o. female.  Here with abdominal pain and constipation.  Using laxatives without much help.  Having some nausea and vomiting.  A lot of discomfort.  Not on any opioids.  No major abdominal surgery history.  Does not drink a lot of fluids and suspects that is why she gets constipated so often.  It is hard for her at times to take MiraLAX because she has to drink a lot with it.  She denies any chest pain shortness of breath weakness numbness tingling.  No pain with urination.  The history is provided by the patient.       Home Medications Prior to Admission medications   Medication Sig Start Date End Date Taking? Authorizing Provider  amoxicillin-clavulanate (AUGMENTIN) 875-125 MG tablet Take 1 tablet by mouth every 12 (twelve) hours. 05/16/23  Yes Naomi Castrogiovanni, DO  ondansetron (ZOFRAN) 4 MG tablet Take 1 tablet (4 mg total) by mouth every 6 (six) hours. 05/16/23  Yes Reham Slabaugh, DO  aspirin 81 MG chewable tablet Chew 81 mg by mouth daily.    [provider]  atorvastatin (LIPITOR) 40 MG tablet Take 1 tablet (40 mg total) by mouth daily. 07/01/19   Leatha Gilding, MD  levothyroxine (SYNTHROID) 50 MCG tablet Take 1 tablet (50 mcg total) by mouth daily before breakfast. 07/01/19   Leatha Gilding, MD  lisinopril-hydrochlorothiazide (ZESTORETIC) 10-12.5 MG tablet Take 1 tablet by mouth daily. 07/01/19   Leatha Gilding, MD  meclizine (ANTIVERT) 25 MG tablet Take 1 tablet (25 mg total) by mouth 3 (three) times daily as needed for dizziness or nausea. 07/01/19   Leatha Gilding, MD  metoprolol succinate (TOPROL-XL) 50 MG 24 hr tablet Take 1 tablet (50 mg total) by mouth daily. 07/01/19   Leatha Gilding, MD  ondansetron (ZOFRAN ODT) 4 MG  disintegrating tablet Take 1 tablet (4 mg total) by mouth every 8 (eight) hours as needed for nausea or vomiting. 07/01/19   Leatha Gilding, MD      Allergies    Patient has no known allergies.    Review of Systems   Review of Systems  Physical Exam Updated Vital Signs BP 135/61 (BP Location: Right Arm)   Pulse 74   Temp 97.9 F (36.6 C) (Oral)   Resp 16   SpO2 97%  Physical Exam Vitals and nursing note reviewed.  Constitutional:      General: She is not in acute distress.    Appearance: She is well-developed.  HENT:     Head: Normocephalic and atraumatic.     Mouth/Throat:     Mouth: Mucous membranes are moist.  Eyes:     Extraocular Movements: Extraocular movements intact.     Conjunctiva/sclera: Conjunctivae normal.     Pupils: Pupils are equal, round, and reactive to light.  Cardiovascular:     Rate and Rhythm: Normal rate and regular rhythm.     Heart sounds: Normal heart sounds. No murmur heard. Pulmonary:     Effort: Pulmonary effort is normal. No respiratory distress.     Breath sounds: Normal breath sounds.  Abdominal:     Palpations: Abdomen is soft.     Tenderness: There is generalized abdominal tenderness.  Musculoskeletal:  General: No swelling.     Cervical back: Neck supple.  Skin:    General: Skin is warm and dry.     Capillary Refill: Capillary refill takes less than 2 seconds.  Neurological:     Mental Status: She is alert.  Psychiatric:        Mood and Affect: Mood normal.     ED Results / Procedures / Treatments   Labs (all labs ordered are listed, but only abnormal results are displayed) Labs Reviewed  CBC WITH DIFFERENTIAL/PLATELET - Abnormal; Notable for the following components:      Result Value   Hemoglobin 15.3 (*)    All other components within normal limits  COMPREHENSIVE METABOLIC PANEL - Abnormal; Notable for the following components:   Glucose, Bld 102 (*)    Calcium 8.8 (*)    Total Bilirubin 2.5 (*)    All  other components within normal limits  LIPASE, BLOOD    EKG None  Radiology CT ABDOMEN PELVIS W CONTRAST  Result Date: 05/16/2023 CLINICAL DATA:  Abdominal pain, constipation, loss of appetite EXAM: CT ABDOMEN AND PELVIS WITH CONTRAST TECHNIQUE: Multidetector CT imaging of the abdomen and pelvis was performed using the standard protocol following bolus administration of intravenous contrast. RADIATION DOSE REDUCTION: This exam was performed according to the departmental dose-optimization program which includes automated exposure control, adjustment of the mA and/or kV according to patient size and/or use of iterative reconstruction technique. CONTRAST:  OMNIPAQUE IOHEXOL 300 MG/ML  SOLN COMPARISON:  06/26/2019 FINDINGS: Lower chest: Elevation of the right hemidiaphragm. No acute findings. Hepatobiliary: No focal liver abnormality is seen. Status post cholecystectomy. No biliary dilatation. Pancreas: No focal abnormality or ductal dilatation. Spleen: No focal abnormality.  Normal size. Adrenals/Urinary Tract: 2.5 cm benign-appearing cyst in the midpole of the right kidney for which no additional imaging is recommended. No suspicious renal or adrenal lesion. No stones or hydronephrosis. Urinary bladder unremarkable. Stomach/Bowel: Sigmoid diverticulosis. Mild haziness/stranding around the sigmoid colon concerning for active diverticulitis. Large stool burden throughout the colon. Stomach and small bowel decompressed. Vascular/Lymphatic: Diffuse aortoiliac atherosclerosis. No evidence of aneurysm or adenopathy. Reproductive: Uterus and adnexa unremarkable.  No mass. Other: No free fluid or free air. Musculoskeletal: No acute bony abnormality. IMPRESSION: Sigmoid diverticulosis. Haziness/stranding around the sigmoid colon concerning for active diverticulitis. No complicating feature. Large stool burden. Aortoiliac atherosclerosis. Electronically Signed   By: Charlett Nose M.D.   On: 05/16/2023 13:02     Procedures Procedures    Medications Ordered in ED Medications  ondansetron (ZOFRAN) injection 4 mg (4 mg Intravenous Given 05/16/23 0935)  acetaminophen (TYLENOL) tablet 650 mg (650 mg Oral Given 05/16/23 0941)  iohexol (OMNIPAQUE) 300 MG/ML solution 100 mL (100 mLs Intravenous Contrast Given 05/16/23 1109)  ketorolac (TORADOL) 15 MG/ML injection 15 mg (15 mg Intravenous Given 05/16/23 1141)    ED Course/ Medical Decision Making/ A&P                                 Medical Decision Making Amount and/or Complexity of Data Reviewed Labs: ordered. Radiology: ordered.  Risk OTC drugs. Prescription drug management.   Lesleigh Noe is here with abdominal pain and constipation.  History of A-fib mitral valve prolapse with pacemaker.  Differential diagnosis likely constipation but will evaluate for bowel obstruction or other acute intra-abdominal process.  She is well-appearing otherwise.  Will get CBC CMP lipase CT scan abdomen and pelvis.  Will give Tylenol and Zofran and reevaluate.  Lab work is unremarkable per my review and interpretation.  No significant anemia or electrolyte abnormality or kidney injury or leukocytosis.  CT scan with possibly mild acute diverticulitis and large constipation but no fecal impaction.  Recommend MiraLAX, Zofran and will prescribe Augmentin for diverticulitis.  Understands return precautions.  Discharged in good condition.  Understands follow-up with PCP.  This chart was dictated using voice recognition software.  Despite best efforts to proofread,  errors can occur which can change the documentation meaning.         Final Clinical Impression(s) / ED Diagnoses Final diagnoses:  Acute diverticulitis  Constipation, unspecified constipation type    Rx / DC Orders ED Discharge Orders          Ordered    amoxicillin-clavulanate (AUGMENTIN) 875-125 MG tablet  Every 12 hours        05/16/23 1320    ondansetron (ZOFRAN) 4 MG tablet  Every 6  hours        05/16/23 1320              Revel Stellmach, DO 05/16/23 1321

## 2023-08-09 ENCOUNTER — Other Ambulatory Visit (HOSPITAL_BASED_OUTPATIENT_CLINIC_OR_DEPARTMENT_OTHER): Payer: Self-pay

## 2023-08-09 ENCOUNTER — Other Ambulatory Visit: Payer: Self-pay

## 2023-08-09 ENCOUNTER — Emergency Department (HOSPITAL_BASED_OUTPATIENT_CLINIC_OR_DEPARTMENT_OTHER)
Admission: EM | Admit: 2023-08-09 | Discharge: 2023-08-09 | Disposition: A | Discharge status: Home / Self Care | Payer: Medicare HMO | Attending: Emergency Medicine | Admitting: Emergency Medicine

## 2023-08-09 ENCOUNTER — Emergency Department (HOSPITAL_BASED_OUTPATIENT_CLINIC_OR_DEPARTMENT_OTHER): Payer: Medicare HMO

## 2023-08-09 ENCOUNTER — Encounter (HOSPITAL_BASED_OUTPATIENT_CLINIC_OR_DEPARTMENT_OTHER): Payer: Self-pay | Admitting: Emergency Medicine

## 2023-08-09 DIAGNOSIS — R519 Headache, unspecified: Secondary | ICD-10-CM | POA: Diagnosis present

## 2023-08-09 DIAGNOSIS — E039 Hypothyroidism, unspecified: Secondary | ICD-10-CM | POA: Diagnosis not present

## 2023-08-09 DIAGNOSIS — Z7951 Long term (current) use of inhaled steroids: Secondary | ICD-10-CM | POA: Insufficient documentation

## 2023-08-09 DIAGNOSIS — J101 Influenza due to other identified influenza virus with other respiratory manifestations: Secondary | ICD-10-CM | POA: Diagnosis not present

## 2023-08-09 DIAGNOSIS — Z79890 Hormone replacement therapy (postmenopausal): Secondary | ICD-10-CM | POA: Insufficient documentation

## 2023-08-09 DIAGNOSIS — Z20822 Contact with and (suspected) exposure to covid-19: Secondary | ICD-10-CM | POA: Diagnosis not present

## 2023-08-09 DIAGNOSIS — H40211 Acute angle-closure glaucoma, right eye: Secondary | ICD-10-CM | POA: Diagnosis not present

## 2023-08-09 DIAGNOSIS — H5711 Ocular pain, right eye: Secondary | ICD-10-CM | POA: Diagnosis present

## 2023-08-09 LAB — URINALYSIS, MICROSCOPIC (REFLEX)

## 2023-08-09 LAB — RESP PANEL BY RT-PCR (RSV, FLU A&B, COVID)  RVPGX2
Influenza A by PCR: POSITIVE — AB
Influenza B by PCR: NEGATIVE
Resp Syncytial Virus by PCR: NEGATIVE
SARS Coronavirus 2 by RT PCR: NEGATIVE

## 2023-08-09 LAB — BASIC METABOLIC PANEL
Anion gap: 11 (ref 5–15)
BUN: 14 mg/dL (ref 8–23)
CO2: 24 mmol/L (ref 22–32)
Calcium: 8.9 mg/dL (ref 8.9–10.3)
Chloride: 98 mmol/L (ref 98–111)
Creatinine, Ser: 0.61 mg/dL (ref 0.44–1.00)
GFR, Estimated: >60 mL/min (ref >60)
Glucose, Bld: 106 mg/dL — ABNORMAL HIGH (ref 70–99)
Potassium: 3.9 mmol/L (ref 3.5–5.1)
Sodium: 133 mmol/L — ABNORMAL LOW (ref 135–145)

## 2023-08-09 LAB — URINALYSIS, ROUTINE W REFLEX MICROSCOPIC
Glucose, UA: NEGATIVE mg/dL
Ketones, ur: 80 mg/dL — AB
Leukocytes,Ua: NEGATIVE
Nitrite: NEGATIVE
Protein, ur: 100 mg/dL — AB
Specific Gravity, Urine: 1.025 (ref 1.005–1.030)
pH: 5.5 (ref 5.0–8.0)

## 2023-08-09 LAB — CBC
HCT: 44.1 % (ref 36.0–46.0)
Hemoglobin: 15 g/dL (ref 12.0–15.0)
MCH: 31.8 pg (ref 26.0–34.0)
MCHC: 34 g/dL (ref 30.0–36.0)
MCV: 93.4 fL (ref 80.0–100.0)
Platelets: 213 10*3/uL (ref 150–400)
RBC: 4.72 MIL/uL (ref 3.87–5.11)
RDW: 14.1 % (ref 11.5–15.5)
WBC: 4.6 10*3/uL (ref 4.0–10.5)
nRBC: 0 % (ref 0.0–0.2)

## 2023-08-09 LAB — SEDIMENTATION RATE: Sed Rate: 19 mm/h (ref 0–22)

## 2023-08-09 MED ORDER — TIMOLOL MALEATE 0.5 % OP SOLN
1.0000 [drp] | Freq: Once | OPHTHALMIC | Status: AC
  Administered 2023-08-09: 1 [drp] via OPHTHALMIC
  Filled 2023-08-09: qty 5

## 2023-08-09 MED ORDER — FLUORESCEIN SODIUM 1 MG OP STRP
1.0000 | ORAL_STRIP | Freq: Once | OPHTHALMIC | Status: AC
  Administered 2023-08-09: 1 via OPHTHALMIC
  Filled 2023-08-09: qty 1

## 2023-08-09 MED ORDER — TETRACAINE HCL 0.5 % OP SOLN
2.0000 [drp] | Freq: Once | OPHTHALMIC | Status: AC
  Administered 2023-08-09: 2 [drp] via OPHTHALMIC
  Filled 2023-08-09: qty 4

## 2023-08-09 MED ORDER — ACETAZOLAMIDE 250 MG PO TABS
500.0000 mg | ORAL_TABLET | Freq: Once | ORAL | Status: AC
  Administered 2023-08-09: 500 mg via ORAL
  Filled 2023-08-09: qty 2

## 2023-08-09 MED ORDER — BRIMONIDINE TARTRATE 0.2 % OP SOLN
1.0000 [drp] | Freq: Once | OPHTHALMIC | Status: AC
  Administered 2023-08-09: 1 [drp] via OPHTHALMIC
  Filled 2023-08-09: qty 5

## 2023-08-09 MED ORDER — ACETAZOLAMIDE 125 MG PO TABS
500.0000 mg | ORAL_TABLET | Freq: Two times a day (BID) | ORAL | 0 refills | Status: AC
  Filled 2023-08-09: qty 56, 7d supply, fill #0

## 2023-08-09 MED ORDER — METOCLOPRAMIDE HCL 5 MG/ML IJ SOLN
10.0000 mg | Freq: Once | INTRAMUSCULAR | Status: AC
  Administered 2023-08-09: 10 mg via INTRAVENOUS
  Filled 2023-08-09: qty 2

## 2023-08-09 MED ORDER — ACETAMINOPHEN 500 MG PO TABS
1000.0000 mg | ORAL_TABLET | Freq: Once | ORAL | Status: AC
  Administered 2023-08-09: 1000 mg via ORAL
  Filled 2023-08-09: qty 2

## 2023-08-09 MED ORDER — TIMOLOL MALEATE 0.5 % OP SOLN
1.0000 [drp] | Freq: Two times a day (BID) | OPHTHALMIC | 0 refills | Status: AC
  Filled 2023-08-09: qty 10, 100d supply, fill #0

## 2023-08-09 MED ORDER — BRIMONIDINE TARTRATE 0.2 % OP SOLN
1.0000 [drp] | Freq: Two times a day (BID) | OPHTHALMIC | 12 refills | Status: AC
  Filled 2023-08-09: qty 5, 50d supply, fill #0

## 2023-08-09 MED ORDER — SODIUM CHLORIDE 0.9 % IV BOLUS
1000.0000 mL | Freq: Once | INTRAVENOUS | Status: AC
  Administered 2023-08-09: 1000 mL via INTRAVENOUS

## 2023-08-09 MED ORDER — ONDANSETRON HCL 4 MG PO TABS
4.0000 mg | ORAL_TABLET | Freq: Four times a day (QID) | ORAL | 0 refills | Status: AC
  Filled 2023-08-09: qty 12, 3d supply, fill #0

## 2023-08-09 NOTE — ED Triage Notes (Signed)
 Pt reports that over the weekend she became dizzy, weak, nauseas, runny nose and a headache.  She was seen by urgent care yesterday and had a negative covid and flu test.  She also reports that on Sunday she started to have a jabbing feeling into my right eye.  Sunday the vision in the right eye became blurry.

## 2023-08-09 NOTE — ED Provider Notes (Signed)
 [place] [facility] AT [facility] Provider Note   CSN: [id number] Arrival date & time: 08/09/23  0419     History  Chief Complaint  Patient presents with   Dizziness   Eye Pain    Nancy Franco is a 83 y.o. female.  Patient is an 83 year old female with a past medical history of A-fib and hypothyroidism presenting to the emergency department with dizziness, headache and right thigh pain.  Patient states that she started to feel sick on Friday with congestion, body aches and nausea.  She states that she has not been able to eat or drink much throughout the weekend due to her nausea.  She states she has been feeling lightheaded and dizzy upon standing and feels dehydrated.  She states that on Sunday she started to develop a right sided headache with right eye pain as well as blurred vision in the right eye.  She denies any fevers, cough or diarrhea.  She denies any known sick contacts.  She was seen at urgent care yesterday and tested negative for COVID and flu there and was started on doxycycline for sinusitis but states that she has not taken this yet due to her nausea.  The history is provided by the patient and a relative.  Dizziness Eye Pain       Home Medications Prior to Admission medications   Medication Sig Start Date End Date Taking? Authorizing Provider  acetaZOLAMIDE  (DIAMOX ) 125 MG tablet Take 4 tablets (500 mg total) by mouth 2 (two) times daily for 7 days. 08/09/23 08/16/23 Yes Kingsley, Nagee Goates K, DO  brimonidine  (ALPHAGAN ) 0.2 % ophthalmic solution Place 1 drop into the right eye 2 (two) times daily. 08/09/23  Yes Ellouise, Lashon Beringer K, DO  ondansetron  (ZOFRAN ) 4 MG tablet Take 1 tablet (4 mg total) by mouth every 6 (six) hours. 08/09/23  Yes Ellouise, Wende Longstreth K, DO  timolol  (TIMOPTIC ) 0.5 % ophthalmic solution Place 1 drop into the right eye every 12 (twelve) hours. 08/09/23  Yes Kingsley, Malayna Noori K, DO  amoxicillin -clavulanate (AUGMENTIN ) 875-125  MG tablet Take 1 tablet by mouth every 12 (twelve) hours. 05/16/23   Curatolo, Adam, DO  aspirin  81 MG chewable tablet Chew 81 mg by mouth daily.    [provider]  atorvastatin  (LIPITOR) 40 MG tablet Take 1 tablet (40 mg total) by mouth daily. 07/01/19   Gherghe, Costin M, MD  levothyroxine  (SYNTHROID ) 50 MCG tablet Take 1 tablet (50 mcg total) by mouth daily before breakfast. 07/01/19   Gherghe, Costin M, MD  lisinopril -hydrochlorothiazide  (ZESTORETIC ) 10-12.5 MG tablet Take 1 tablet by mouth daily. 07/01/19   Gherghe, Costin M, MD  meclizine  (ANTIVERT ) 25 MG tablet Take 1 tablet (25 mg total) by mouth 3 (three) times daily as needed for dizziness or nausea. 07/01/19   Gherghe, Costin M, MD  metoprolol  succinate (TOPROL -XL) 50 MG 24 hr tablet Take 1 tablet (50 mg total) by mouth daily. 07/01/19   Gherghe, Costin M, MD  ondansetron  (ZOFRAN  ODT) 4 MG disintegrating tablet Take 1 tablet (4 mg total) by mouth every 8 (eight) hours as needed for nausea or vomiting. 07/01/19   Gherghe, Costin M, MD      Allergies    Patient has no known allergies.    Review of Systems   Review of Systems  Eyes:  Positive for pain.  Neurological:  Positive for dizziness.    Physical Exam Updated Vital Signs BP (!) 177/79   Pulse 81   Temp 97.6  F (36.4 C) (Oral)   Resp 18   Ht 5' 3 (1.6 m)   Wt 55.3 kg   SpO2 97%   BMI 21.61 kg/m  Physical Exam Vitals and nursing note reviewed.  Constitutional:      General: She is not in acute distress.    Appearance: Normal appearance.  HENT:     Head: Normocephalic and atraumatic.     Comments: R-sided temporal tenderness    Nose: Nose normal.     Mouth/Throat:     Mouth: Mucous membranes are dry.     Pharynx: Oropharynx is clear.  Eyes:     Extraocular Movements: Extraocular movements intact.     Comments: R pupil ~6mm non-reactive, cloudy appearing pupil, L pupil ~38mm and reactive R eye conjunctival injection  Cardiovascular:     Rate and  Rhythm: Normal rate and regular rhythm.     Heart sounds: Normal heart sounds.  Pulmonary:     Effort: Pulmonary effort is normal.     Breath sounds: Normal breath sounds.  Abdominal:     General: Abdomen is flat.     Palpations: Abdomen is soft.     Tenderness: There is no abdominal tenderness.  Musculoskeletal:        General: Normal range of motion.     Cervical back: Normal range of motion.  Skin:    General: Skin is warm and dry.  Neurological:     General: No focal deficit present.     Mental Status: She is alert and oriented to person, place, and time.     Sensory: No sensory deficit.     Motor: No weakness.  Psychiatric:        Mood and Affect: Mood normal.        Behavior: Behavior normal.     ED Results / Procedures / Treatments   Labs (all labs ordered are listed, but only abnormal results are displayed) Labs Reviewed  RESP PANEL BY RT-PCR (RSV, FLU A&B, COVID)  RVPGX2 - Abnormal; Notable for the following components:      Result Value   Influenza A by PCR POSITIVE (*)    All other components within normal limits  BASIC METABOLIC PANEL - Abnormal; Notable for the following components:   Sodium 133 (*)    Glucose, Bld 106 (*)    All other components within normal limits  URINALYSIS, ROUTINE W REFLEX MICROSCOPIC - Abnormal; Notable for the following components:   Hgb urine dipstick MODERATE (*)    Bilirubin Urine SMALL (*)    Ketones, ur 80 (*)    Protein, ur 100 (*)    All other components within normal limits  URINALYSIS, MICROSCOPIC (REFLEX) - Abnormal; Notable for the following components:   Bacteria, UA RARE (*)    All other components within normal limits  CBC  SEDIMENTATION RATE  CBG MONITORING, ED    EKG None  Radiology CT HEAD WO CONTRAST Result Date: 08/09/2023 CLINICAL DATA:  83 year old female with dizziness, weakness, nausea, headache, rhinorrhea. Right eye discomfort. Right eye blurred vision. EXAM: CT HEAD WITHOUT CONTRAST TECHNIQUE:  Contiguous axial images were obtained from the base of the skull through the vertex without intravenous contrast. RADIATION DOSE REDUCTION: This exam was performed according to the departmental dose-optimization program which includes automated exposure control, adjustment of the mA and/or kV according to patient size and/or use of iterative reconstruction technique. COMPARISON:  None Available. FINDINGS: Brain: Cerebral volume is within normal limits for age. No midline shift,  mass effect, or evidence of intracranial mass lesion. No ventriculomegaly. No acute intracranial hemorrhage identified. Patchy and confluent widespread bilateral cerebral white matter hypodensity, fairly symmetric. Deep white matter capsule involvement. Relative sparing of the deep gray nuclei. No definite cortical encephalomalacia. Vascular: Advanced Calcified atherosclerosis at the skull base. Some generalized intracranial artery tortuosity. No suspicious intracranial vascular hyperdensity. Skull: Intact.  No acute osseous abnormality identified. Sinuses/Orbits: Mild to moderate ethmoid sinus mucosal thickening. Other Visualized paranasal sinuses and mastoids are well aerated. Other: Visualized scalp soft tissues are within normal limits. Visualized orbit soft tissues are within normal limits. IMPRESSION: 1. No acute intracranial abnormality identified. 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 Intracranial artery tortuosity and calcified atherosclerosis. 2. Mild to moderate ethmoid sinus inflammation. Electronically Signed   By: VEAR Hurst M.D.   On: 08/09/2023 07:54    Procedures .Critical Care  Performed by: Kingsley, Dajanee Voorheis K, DO Authorized by: Ellouise Richerd POUR, DO   Critical care provider statement:    Critical care time (minutes):  30   Critical care was time spent personally by me on the following activities:  Development of treatment plan with patient or surrogate, discussions  with consultants, evaluation of patient's response to treatment, examination of patient, ordering and review of laboratory studies, ordering and review of radiographic studies, ordering and performing treatments and interventions, pulse oximetry, re-evaluation of patient's condition and review of old charts     Medications Ordered in ED Medications  acetaZOLAMIDE  (DIAMOX ) tablet 500 mg (has no administration in time range)  brimonidine  (ALPHAGAN ) 0.2 % ophthalmic solution 1 drop (has no administration in time range)  timolol  (TIMOPTIC ) 0.5 % ophthalmic solution 1 drop (has no administration in time range)  fluorescein  ophthalmic strip 1 strip (1 strip Both Eyes Given by Other 08/09/23 0757)  tetracaine  (PONTOCAINE) 0.5 % ophthalmic solution 2 drop (2 drops Both Eyes Given by Other 08/09/23 0757)  sodium chloride  0.9 % bolus 1,000 mL (0 mLs Intravenous Stopped 08/09/23 0903)  metoCLOPramide  (REGLAN ) injection 10 mg (10 mg Intravenous Given 08/09/23 0801)  acetaminophen  (TYLENOL ) tablet 1,000 mg (1,000 mg Oral Given 08/09/23 0758)    ED Course/ Medical Decision Making/ A&P Clinical Course as of 08/09/23 0936  Tue Aug 09, 2023  0838 R eye 20/200, L eye 20/40. Patient reports wearing reading glasses at baseline. [VK]  0903 L eye pressure 16; R eye pressure measurements 35 - 48 - 25; will discuss with ophtho with concern for acute angle closure glaucoma [VK]  0918 I spoke with Dr. Maree with ophtho who recommended Diamox  500 mg PO BID, brimonidine , timolol  BID, follow up in clinic could be seen today in [facility] or tomorrow in [place] at [facility]. I spoke with the patient and they are available to be seen in the office today. Will be given first dose of meds here and recommended to go straight to ophtho office at discharge. [VK]    Clinical Course User Index [VK] Kingsley, Kel Senn K, DO                                 Medical Decision Making This patient presents to the ED with chief complaint(s) of  dizziness, headache, right eye pain with pertinent past medical history of A-fib on Eliquis, hypothyroidism which further complicates the presenting complaint. The complaint involves an extensive differential diagnosis and also carries with it a high risk of complications and  morbidity.    The differential diagnosis includes viral syndrome, dehydration, electrolyte abnormality, acute angle-closure glaucoma, GCA arteritis, tension headache, migraine headache, ICH, mass effect  Additional history obtained: Additional history obtained from family Records reviewed Care Everywhere/External Records  ED Course and Reassessment: On patient's arrival she is hemodynamically stable in no acute distress.  She was initially evaluated in triage and had labs and head CT performed.  Labs show that she is flu A+.  She has been symptomatic since Friday and was outside the window for Tamiflu treatment.  No signs of severe dehydration with normal sed rate making GCA less likely.  Head CT showed no acute disease.  She does have unequal pupils with right conjunctival injection and will need further ocular exam.  Independent labs interpretation:  The following labs were independently interpreted: flu A positive, mild ketonuria  Independent visualization of imaging: - I independently visualized the following imaging with scope of interpretation limited to determining acute life threatening conditions related to emergency care: CTH, which revealed no acute disease  Consultation: - Consulted or discussed management/test interpretation w/ external professional: ophtho  Consideration for admission or further workup: Patient has no emergent conditions requiring admission or further work-up at this time and is stable for discharge home with primary care and ophtho follow-up  Social Determinants of health: N/A    Risk OTC drugs. Prescription drug management.          Final Clinical Impression(s) / ED  Diagnoses Final diagnoses:  Acute angle-closure glaucoma of right eye  Influenza A    Rx / DC Orders ED Discharge Orders          Ordered    acetaZOLAMIDE  (DIAMOX ) 125 MG tablet  2 times daily        08/09/23 0933    timolol  (TIMOPTIC ) 0.5 % ophthalmic solution  Every 12 hours        08/09/23 0933    brimonidine  (ALPHAGAN ) 0.2 % ophthalmic solution  2 times daily        08/09/23 0933    ondansetron  (ZOFRAN ) 4 MG tablet  Every 6 hours        08/09/23 0933              Kingsley, Jibri Schriefer K, DO 08/09/23 [id number]

## 2023-08-09 NOTE — Discharge Instructions (Signed)
 You were seen in the emergency department for your dizziness, eye pain and headaches.  You did test positive for the flu here but had no severe dehydration.  You did take Zofran  as needed for nausea and make sure that you are drinking plenty of fluids and staying well-hydrated and can take Tylenol  as needed for headaches or bodyaches.  Does appear that you have what is called acute angle-closure glaucoma in your eye which is a fluid buildup in your eye that is causing your pain and blurred vision.  I have given you medication and eyedrops to help reduce the pressure in your eye and you should go to the ophthalmologist office today for reassessment and further management of your eye.  You can let them know that you did receive your first dose of the medications in the ER.  You can follow-up with your primary doctor regarding your flu symptoms.  You should return to the emergency department if you are having significantly worsening headaches, repetitive vomiting despite the nausea medicine, severe shortness of breath or any other new or concerning symptoms.
